# Patient Record
Sex: Female | Born: 1950
Health system: Southern US, Community
[De-identification: ages and names within clinical notes are randomized; demographics above are authoritative.]

## PROBLEM LIST (undated history)

## (undated) DIAGNOSIS — E1140 Type 2 diabetes mellitus with diabetic neuropathy, unspecified: Secondary | ICD-10-CM

## (undated) DIAGNOSIS — I10 Essential (primary) hypertension: Secondary | ICD-10-CM

## (undated) DIAGNOSIS — I350 Nonrheumatic aortic (valve) stenosis: Principal | ICD-10-CM

## (undated) DIAGNOSIS — E785 Hyperlipidemia, unspecified: Secondary | ICD-10-CM

## (undated) HISTORY — DX: Type 2 diabetes mellitus with diabetic neuropathy, unspecified: E11.40

## (undated) HISTORY — DX: Nonrheumatic aortic (valve) stenosis: I35.0

## (undated) HISTORY — DX: Hyperlipidemia, unspecified: E78.5

---

## 1997-05-31 ENCOUNTER — Other Ambulatory Visit: Admission: RE | Admit: 1997-05-31 | Discharge: 1997-05-31 | Payer: Self-pay | Admitting: Obstetrics & Gynecology

## 1999-01-23 ENCOUNTER — Other Ambulatory Visit: Admission: RE | Admit: 1999-01-23 | Discharge: 1999-01-23 | Payer: Self-pay | Admitting: Obstetrics & Gynecology

## 2000-10-22 ENCOUNTER — Other Ambulatory Visit: Admission: RE | Admit: 2000-10-22 | Discharge: 2000-10-22 | Payer: Self-pay | Admitting: Obstetrics & Gynecology

## 2012-07-02 ENCOUNTER — Other Ambulatory Visit: Payer: Self-pay | Admitting: Family Medicine

## 2012-07-02 DIAGNOSIS — Z1231 Encounter for screening mammogram for malignant neoplasm of breast: Secondary | ICD-10-CM

## 2012-07-16 ENCOUNTER — Ambulatory Visit: Payer: Self-pay

## 2012-07-21 ENCOUNTER — Encounter: Payer: BC Managed Care – PPO | Attending: Family | Admitting: *Deleted

## 2012-07-21 VITALS — Ht 65.5 in | Wt 156.2 lb

## 2012-07-21 DIAGNOSIS — E119 Type 2 diabetes mellitus without complications: Secondary | ICD-10-CM | POA: Insufficient documentation

## 2012-07-21 DIAGNOSIS — Z713 Dietary counseling and surveillance: Secondary | ICD-10-CM | POA: Insufficient documentation

## 2012-07-31 ENCOUNTER — Encounter: Payer: Self-pay | Admitting: *Deleted

## 2012-07-31 NOTE — Progress Notes (Signed)
Patient was seen on 07/21/2012 for the first of a series of three diabetes self-management courses [facility]   Current HbA1c: 13.2% on 06/22/2012  The following learning objectives were met by the patient during this course:   Defines the role of glucose and insulin  Identifies type of diabetes and pathophysiology  Defines the diagnostic criteria for diabetes and prediabetes  States the risk factors for Type 2 Diabetes  States the symptoms of Type 2 Diabetes  Defines Type 2 Diabetes treatment goals  Defines Type 2 Diabetes treatment options  States the rationale for glucose monitoring  Identifies A1C, glucose targets, and testing times  Identifies proper sharps disposal  Defines the purpose of a diabetes food plan  Identifies carbohydrate food groups  Defines effects of carbohydrate foods on glucose levels  Identifies carbohydrate choices/grams/food labels  States benefits of physical activity and effect on glucose  Review of suggested activity guidelines  Handouts given during class include:  Type 2 Diabetes: Basics Book  My [place] and Activity Log  Your patient has identified their diabetes self-care support plan as:  Continue with diabetes education   Consider support group meetings  Follow-Up Plan: Core Class 2

## 2012-08-05 ENCOUNTER — Ambulatory Visit
Admission: RE | Admit: 2012-08-05 | Discharge: 2012-08-05 | Disposition: A | Discharge status: Home / Self Care | Payer: BC Managed Care – PPO | Source: Ambulatory Visit | Attending: Family Medicine | Admitting: Family Medicine

## 2012-08-05 DIAGNOSIS — Z1231 Encounter for screening mammogram for malignant neoplasm of breast: Secondary | ICD-10-CM

## 2012-08-11 ENCOUNTER — Encounter: Payer: BC Managed Care – PPO | Attending: Family

## 2012-08-11 DIAGNOSIS — E119 Type 2 diabetes mellitus without complications: Secondary | ICD-10-CM | POA: Insufficient documentation

## 2012-08-11 DIAGNOSIS — Z713 Dietary counseling and surveillance: Secondary | ICD-10-CM | POA: Insufficient documentation

## 2012-08-12 NOTE — Progress Notes (Signed)
Patient was seen on 08/11/12 for the second of a series of three diabetes self-management courses at [facility]. The following learning objectives were met by the patient during this course:   Explain basic nutrition maintenance and quality assurance  Describe causes, symptoms and treatment of hypoglycemia and hyperglycemia  Explain how to manage diabetes during illness  Describe the importance of good nutrition for health and healthy eating strategies  List strategies to follow meal plan when dining out  Describe the effects of alcohol on glucose and how to use it safely  Describe problem solving skills for day-to-day glucose challenges  Describe strategies to use when treatment plan needs to change  Identify important factors involved in successful weight loss  Describe ways to remain physically active  Describe the impact of regular activity on insulin resistance  Identify current diabetes medications, their action on blood glucose, and [pssible side effects.  Handouts given in class:  Refrigerator magnet for Sick Day Guidelines  [facility] Oral medication/insulin handout  Your patient has identified their diabetes self-care support plan as:  [facility] support group  Follow-Up Plan: Patient will attend the final class of the ADA Diabetes Self-Care Education.

## 2012-08-25 DIAGNOSIS — E119 Type 2 diabetes mellitus without complications: Secondary | ICD-10-CM

## 2012-08-28 NOTE — Progress Notes (Signed)
Patient was seen on 08/25/12 for the third of a series of three diabetes self-management courses [facility]. The following learning objectives were met by the patient during this course:    Describe how diabetes changes over time   Identify diabetes complications and ways to prevent them   Describe strategies that can promote heart health including lowering blood pressure and cholesterol   Describe strategies to lower dietary fat and sodium in the diet   Identify physical activities that benefit cardiovascular health   Describe role of stress on blood glucose and develop strategies to address psychosocial issues   Evaluate success in meeting personal goal   Describe the belief that they can live successfully with diabetes day to day   Establish 2-3 goals that they will plan to diligently work on until they return for the free 85-month follow-up visit  The following handouts were given in class:  Goal setting handout  Class evaluation form  Low-sodium seasoning tips  Stress management handout  Your patient has established the following 4 month goals for diabetes self-care:  Count carbohydrates at most of my meals and snacks  Increase my activity level  Your patient has identified these potential barriers to change:  Moderate confidence in making these changes on scale of 6 out of 10  Your patient has identified their diabetes self-care support plan as:  [facility] support group   Follow-Up Plan: Patient was offered a 4 month follow-up visit for diabetes self-management education.

## 2013-01-12 ENCOUNTER — Encounter: Payer: BC Managed Care – PPO | Attending: Family | Admitting: *Deleted

## 2013-01-12 ENCOUNTER — Encounter: Payer: Self-pay | Admitting: *Deleted

## 2013-01-12 VITALS — Ht 65.5 in | Wt 147.6 lb

## 2013-01-12 DIAGNOSIS — Z713 Dietary counseling and surveillance: Secondary | ICD-10-CM | POA: Insufficient documentation

## 2013-01-12 DIAGNOSIS — E119 Type 2 diabetes mellitus without complications: Secondary | ICD-10-CM | POA: Insufficient documentation

## 2013-01-12 NOTE — Patient Instructions (Signed)
PLAN: Contact Tracy's office and request order for testing supplies Begin alternating testing of Fasting glucose and 2 hours after dinner and LOG Try taking 2 tablets of Metformin with Dinner and one tablet with Breakfast. Continue making good food choices and practicing portion control Make appointment with Olivia Mackie for follow-up in approximately 2-3 weeks so that she can evaluate your glucose readings and medication dose.

## 2013-01-12 NOTE — Progress Notes (Signed)
  Patient was seen on 01/12/13 for her 4 month follow-up as a part of the diabetes self-management courses [facility].   Patient self reports the following: Last personal glucose check was Nov. 2014. Is presently out of testing supplies. Was scheduled for follow-up with PCP in December, had to reschedule. Test 1hpp 167mg /dl. She feels she is making good food choices and exercising portion control. Minimal exercise outside of ADLs.. Works 5-6 days per week, very physical job. Is take Metformin 500mg  TID with food.  Diabetes control has improved since diabetes self-management training: significantly Number of days blood glucose is >200: unknown Last MD appointment for diabetes: September 2014 Changes in treatment plan: Return to prescribed testing regimen of alternating FBS and 2hours after dinner. Confidence with ability to manage diabetes: High Areas for improvement with diabetes self-care: Test glucose regularly FBS Willingness to participate in diabetes support group: not at this time  PLAN: Contact Tracy's office and request order for testing supplies Begin alternating testing of Fasting glucose and 2 hours after dinner and LOG Try taking 2 tablets of Metformin with Dinner and one tablet with Breakfast starting this friday. Continue making good food choices and practicing portion control Make appointment with Olivia Mackie for follow-up in approximately 2-3 weeks so that she can evaluate your glucose readings and medication dose.   Follow-Up Plan: Patient to call and schedule as needed.

## 2013-12-05 ENCOUNTER — Encounter (HOSPITAL_COMMUNITY): Payer: Self-pay | Admitting: *Deleted

## 2013-12-05 ENCOUNTER — Emergency Department (HOSPITAL_COMMUNITY)
Admission: EM | Admit: 2013-12-05 | Discharge: 2013-12-05 | Disposition: A | Discharge status: Home / Self Care | Payer: Self-pay | Attending: Emergency Medicine | Admitting: Emergency Medicine

## 2013-12-05 DIAGNOSIS — I152 Hypertension secondary to endocrine disorders: Secondary | ICD-10-CM | POA: Insufficient documentation

## 2013-12-05 DIAGNOSIS — Z79899 Other long term (current) drug therapy: Secondary | ICD-10-CM | POA: Insufficient documentation

## 2013-12-05 DIAGNOSIS — E119 Type 2 diabetes mellitus without complications: Secondary | ICD-10-CM | POA: Insufficient documentation

## 2013-12-05 DIAGNOSIS — R011 Cardiac murmur, unspecified: Secondary | ICD-10-CM | POA: Insufficient documentation

## 2013-12-05 DIAGNOSIS — H539 Unspecified visual disturbance: Secondary | ICD-10-CM | POA: Insufficient documentation

## 2013-12-05 DIAGNOSIS — E785 Hyperlipidemia, unspecified: Secondary | ICD-10-CM | POA: Insufficient documentation

## 2013-12-05 HISTORY — DX: Essential (primary) hypertension: I10

## 2013-12-05 LAB — BASIC METABOLIC PANEL
Anion gap: 14 (ref 5–15)
BUN: 21 mg/dL (ref 6–23)
CO2: 24 meq/L (ref 19–32)
Calcium: 9.7 mg/dL (ref 8.4–10.5)
Chloride: 102 mEq/L (ref 96–112)
Creatinine, Ser: 0.57 mg/dL (ref 0.50–1.10)
GFR calc Af Amer: >90 mL/min (ref >90)
GLUCOSE: 117 mg/dL — AB (ref 70–99)
POTASSIUM: 4.9 meq/L (ref 3.7–5.3)
Sodium: 140 mEq/L (ref 137–147)

## 2013-12-05 LAB — URINALYSIS, ROUTINE W REFLEX MICROSCOPIC
Bilirubin Urine: NEGATIVE
GLUCOSE, UA: NEGATIVE mg/dL
Ketones, ur: NEGATIVE mg/dL
LEUKOCYTES UA: NEGATIVE
Nitrite: NEGATIVE
PROTEIN: 100 mg/dL — AB
SPECIFIC GRAVITY, URINE: 1.012 (ref 1.005–1.030)
Urobilinogen, UA: 0.2 mg/dL (ref 0.0–1.0)
pH: 7 (ref 5.0–8.0)

## 2013-12-05 LAB — URINE MICROSCOPIC-ADD ON

## 2013-12-05 MED ORDER — LISINOPRIL 10 MG PO TABS: 10.0000 mg | ORAL_TABLET | Freq: Every day | ORAL | Status: DC

## 2013-12-05 MED ORDER — CLONIDINE HCL 0.1 MG PO TABS
0.1000 mg | ORAL_TABLET | Freq: Every day | ORAL | Status: DC
  Administered 2013-12-05: 0.1 mg via ORAL
  Filled 2013-12-05: qty 1

## 2013-12-05 MED ORDER — METFORMIN HCL ER (OSM) 1000 MG PO TB24
1000.0000 mg | ORAL_TABLET | Freq: Two times a day (BID) | ORAL | Status: DC
Start: 2013-12-05 — End: 2023-11-04

## 2013-12-05 NOTE — Discharge Instructions (Signed)
-I wrote you a prescription for lisinopril and metformin to get you until you see a PCP. -You should re-establish with a PCP to manage your blood pressure and diabetes. -Some resources are below to help you find a doctor. -I also gave you information for the [place], which would be a reasonable choice. -You should also follow up with an ophthalmologist regarding your blurry vision to check your prescription. -Your PCP can refer you to an ophthalmologist.    Emergency Department Resource Guide 1) Find a Doctor and Pay Out of Pocket Although you won't have to find out who is covered by your insurance plan, it is a good idea to ask around and get recommendations. You will then need to call the office and see if the doctor you have chosen will accept you as a new patient and what types of options they offer for patients who are self-pay. Some doctors offer discounts or will set up payment plans for their patients who do not have insurance, but you will need to ask so you aren't surprised when you get to your appointment.  2) Contact Your Local Health Department Not all health departments have doctors that can see patients for sick visits, but many do, so it is worth a call to see if yours does. If you don't know where your local health department is, you can check in your phone book. The CDC also has a tool to help you locate your state's health department, and many state websites also have listings of all of their local health departments.  3) Find a [facility] If your illness is not likely to be very severe or complicated, you may want to try a walk in clinic. These are popping up all over the country in pharmacies, drugstores, and shopping centers. They're usually staffed by nurse practitioners or physician assistants that have been trained to treat common illnesses and complaints. They're usually fairly quick and inexpensive. However, if you have serious medical issues  or chronic medical problems, these are probably not your best option.  No Primary Care Doctor: - Call Health Connect at  [phone] - they can help you locate a primary care doctor that  accepts your insurance, provides certain services, etc. - Physician Referral Service- [phone]  Chronic Pain Problems: Organization         Address  Phone   Notes  [facility]  [phone] Patients need to be referred by their primary care doctor.   Medication Assistance: Organization         Address  Phone   Notes  [facility] Medication [facility] [address] [place], [place] [address] [phone] --Must be a resident of [facility] -- Must have NO insurance coverage whatsoever (no Medicaid/ Medicare, etc.) -- The pt. MUST have a primary care doctor that directs their care regularly and follows them in the community   MedAssist  [phone]   [employer]  [phone]    Agencies that provide inexpensive medical care: Organization         Address  Phone   Notes  [place]  [phone]   [facility]    [phone]   [facility] [place], [place] [address] [phone]   [place] [address] [place] [phone]   Planned Parenthood    [phone]   [place]    [phone]   [facility] and [facility]  [address], [place] Phone:  [phone], Fax:  [phone] Hours of Operation:  9 am - 6 pm, M-F.  Also accepts Medicaid/Medicare and self-pay.  [facility] for [place], [address], [place] Phone: [phone], Fax: [phone]. Hours of Operation:  8:30 am - 5:30 pm, M-F.  Also accepts Medicaid and self-pay.  [facility] [address] [place] Phone: [phone]   [place], [place], [place]  [phone], Ext. [phone] Mondays & Thursdays: 7-9 AM.  First 15 patients are seen on a first come, first serve basis.    [place] Providers:  Organization         Address  Phone   Notes  [facility] [address], [place] [phone] Also accepts self-pay patients.  [facility] [id number] [place], [place]  [phone]   [place], [address], [place] [phone]   [facility] [address] [place] [phone]   Lucianne Lei [address], [place]   [phone] Only accepts [place] Access [place] patients after they have their name applied to their card.   Self-Pay (no insurance) in [facility]:  Organization         Address  Phone   Notes  Sickle Cell Patients, [facility] Internal Medicine [place] [phone]   [facility] Care [place] [phone]   [facility] [place]  [place], [place], [place] [phone]   [facility]/Dr. Osei-Bonsu  [address], [place] or [address], [place] [phone] Phone number for both [place] and [place] locations is the same.  Urgent Medical and [facility] [address], [place] [phone]   [facility] [address] [place] or [address] [phone]   [facility] [address], [place] [phone], phone; [phone], fax Sees patients 1st and 3rd Saturday of every month.  Must not qualify for public or private insurance (i.e. Medicaid, Medicare, [place] Health Choice, Veterans' Benefits)  Household income should be no more than 200% of the poverty level The clinic cannot treat you if you are pregnant or think you are pregnant  Sexually transmitted  diseases are not treated at the clinic.   Dental Care: Organization         Address  Phone  Notes  [facility] Department of [facility] [place] [phone] Accepts children up to age 56 who are enrolled in [place] or [place]; pregnant women with a Medicaid card; and children who have applied for Medicaid or [place] Health Choice, but were declined, whose parents can pay a reduced fee at time of service.  [facility] Department of [facility]  [address], [place] [phone] Accepts children up to age 36 who are enrolled in [place] or [place]; pregnant women with a Medicaid card; and children who have applied for Medicaid or [place] Health Choice, but were declined, whose parents can pay a reduced fee at time of service.  [place] Adult Dental Access PROGRAM  [place] [phone] Patients are seen by appointment only. Walk-ins are not accepted. [place] will see patients 20 years of age and older. Monday - Tuesday (8am-5pm) Most Wednesdays (8:30-5pm) $30 per visit, cash only  [facility] Adult Dental Access PROGRAM  [address], [facility] [phone] Patients are seen by appointment only. Walk-ins are not accepted. [place] will see patients 37 years of age and older. One Wednesday Evening (Monthly: Volunteer Based).  $30 per visit, cash only  [place]  [phone] for adults; Children under age 40, call Graduate Pediatric Dentistry at [phone]. Children aged 30-14, please call [phone] to request a pediatric application.  Dental services are provided in all areas of dental care including fillings, crowns and bridges, complete and partial dentures, implants, gum treatment, root canals, and extractions. Preventive care is also provided. Treatment is provided to both adults and children. Patients are selected via a  lottery and there is often a waiting list.   [facility] [address] [place]  [phone] [email or web address]   [facility] [address] [place], [place] [phone], Ext. [phone] Second and Fourth Thursday of each month, opens at 6:30 AM; Clinic ends at 9 AM.  Patients are seen on a first-come first-served basis, and a limited number are seen during each clinic.   [facility]  [address], [place] [phone]   Eligibility Requirements You must have lived in [place], [place], or [place] counties for at least the last three months.   You cannot be eligible for state or federal sponsored [employer], including [employer], [place], or [employer].   You generally cannot be eligible for healthcare insurance through your employer.    How to apply: Eligibility screenings are held every Tuesday and Wednesday afternoon from 1:00 pm until 4:00 pm. You do not need an appointment for the interview!  [facility] [address], [place], [place]   [place]  [place] Department  [place]  [phone]    Behavioral Health Resources in the Community: Intensive Outpatient Programs Organization         Address  Phone  Notes  [facility] [place]. [address] [place], [place] [phone]   [facility] Outpatient [address] [place], [place]   ADS: Alcohol & Drug S[facility] [address] [place], [place]   [place] [address],  [place], [place] or [phone]   Substance Abuse Resources Organization         Address  Phone  Notes  Alcohol and Drug Services  [phone]   [place]  [phone]   [facility] [place]   Chinita Pester  [phone]   Residential &  Outpatient Substance Abuse Program  [phone]   Psychological Services Organization         Address  Phone  Notes  [facility] [place]  [place]  [phone]   [place] [address], [place] [phone] or [phone]    Mobile Crisis Teams Organization         Address  Phone  Notes  [facility], Mobile Crisis Care Unit  [phone]  Assertive Psychotherapeutic Services  [address]. Lady Gary, [place]  [facility] [address] [place] [phone]   Self-Help/Support Groups Organization         Address  Phone             Notes  [place]. of [place] - variety of support groups  [place] Call for more information  Narcotics Anonymous (NA), [facility] [address], [employer] [place]  2 meetings at this location   Special educational needs teacher         Address  Phone  Notes  ASAP Residential Treatment [place],    [place]  [phone]   [facility]  [address] [place] [phone], [place], [place]   [place], [place] [phone] Admissions: 8am-3pm M-F  Incentives Substance [place] [address],    [place], [place] [phone]   [facility] [address] [place], [place], [place]   [facility] [address],  [place], [place]   [facility]ight Programs - Intensive Outpatient [address], [place], [place]   [facility] [place]  [place], [place] [phone] or [phone]   [facility] Services (RTS) [address], [place], [place] Accepts Medicaid  [facility] [place] [address],  [place] [phone] Substance Abuse/Addiction Treatment   [facility] Organization          Address  Phone  Notes  [facility]  [phone]   Domenic Schwab, PhD [address] Arlis Porta [place], [place]   [phone] or [phone]   [place] Malone [place], [place] [phone]   [facility] [address] [place], [place] [phone] Insurance/Medicaid/sponsorship through [facility] and Families [address], [address] [place]                                    [place], [place] [phone] [place] [address], [place] [phone]    Dr. Adele Schilder  [phone]   [facility] Dept. 1) [address], [place] 2) [place] 3)  [place] [address], [place] [phone]  [phone]   [place] [phone] or [phone] (After Hours)

## 2013-12-05 NOTE — ED Provider Notes (Signed)
CSN: [id number]     Arrival date & time 12/05/13  1218 History   First MD Initiated Contact with Patient 12/05/13 1320     Chief Complaint  Patient presents with  . Hypertension  . Blurred Vision   HPI Christine Strong is a 63 year old woman with history of DM2, hyperlipidemia, and hypertension presenting with blurred vision and hypertension.  She says that she has not taken her blood pressure medication for about 6 months and she recently ran out of her metformin.  She reports having blurry vision for the over a month. She saw an ophthalmologist a month ago and was given a prescription for glasses, which she reports has helped with her vision.  However, she has continued to notice that her vision is blurry, and it was worse this morning.  She denies double vision.  She thought it could be related to her blood pressure, so she went to an urgent care clinic.  She was noted to have a blood pressure of 224/105, so she was told to come to the ER.  She denies chest pain, shortness of breath, nausea, vomiting, or headache currently.  Past Medical History  Diagnosis Date  . Diabetes mellitus without complication   . Hyperlipidemia   . Hypertension    History reviewed. No pertinent past surgical history. No family history on file. History  Substance Use Topics  . Smoking status: Never Smoker   . Smokeless tobacco: Not on file  . Alcohol Use: Not on file   OB History    No data available     Review of Systems  Constitutional: Negative for fever, chills, activity change, appetite change and fatigue.  HENT: Negative for congestion, rhinorrhea and sore throat.   Eyes: Positive for visual disturbance (Blurry vision.).  Respiratory: Negative for cough, chest tightness, shortness of breath and wheezing.   Cardiovascular: Negative for chest pain, palpitations and leg swelling.  Gastrointestinal: Negative for nausea, vomiting, abdominal pain, diarrhea, constipation and anal bleeding.   Genitourinary: Negative for dysuria and difficulty urinating.  Musculoskeletal: Negative for myalgias, back pain and arthralgias.  Skin: Negative for rash.  Neurological: Negative for dizziness, speech difficulty, weakness, light-headedness, numbness and headaches.      Allergies  Review of patient's allergies indicates no known allergies.  Home Medications   Prior to Admission medications   Medication Sig Start Date End Date Taking? Authorizing Provider  atorvastatin (LIPITOR) 20 MG tablet Take 20 mg by mouth daily.   Yes Historical Provider, MD  Cyanocobalamin (VITAMIN B 12 PO) Take 2 tablets by mouth daily.   Yes Historical Provider, MD  diphenhydrAMINE (BENADRYL) 25 mg capsule Take 25 mg by mouth every 6 (six) hours as needed for sleep.   Yes Historical Provider, MD  lisinopril (PRINIVIL,ZESTRIL) 10 MG tablet Take 10 mg by mouth daily.   Yes Historical Provider, MD  metformin (FORTAMET) 1000 MG (OSM) 24 hr tablet Take 1,000 mg by mouth 2 (two) times daily with a meal.   Yes Historical Provider, MD  Misc Natural Products (GREEN TEA) TABS Take 2 tablets by mouth daily.   Yes Historical Provider, MD   BP 197/91 mmHg  Pulse 74  Temp(Src) 97.7 F (36.5 C) (Oral)  Resp 16  SpO2 99% Physical Exam  Constitutional: She is oriented to person, place, and time. She appears well-developed and well-nourished. No distress.  HENT:  Head: Normocephalic and atraumatic.  Mouth/Throat: No oropharyngeal exudate.  Eyes: Conjunctivae and EOM are normal. Pupils are equal,  round, and reactive to light. No scleral icterus.  Normal optic discs bilaterally with no evidence of retinal hemorrhage on ophthalmoscopy. Confrontation visual fields normal.  Neck: Normal range of motion. Neck supple.  Cardiovascular: Normal rate and regular rhythm.   Murmur (2/6 systolic murmur.) heard. Pulmonary/Chest: Effort normal and breath sounds normal. No respiratory distress.  Abdominal: Soft. Bowel sounds are  normal. She exhibits no distension. There is no tenderness.  Musculoskeletal: Normal range of motion. She exhibits no edema or tenderness.  Neurological: She is alert and oriented to person, place, and time. No cranial nerve deficit. She exhibits normal muscle tone.  Skin: Skin is warm and dry. No rash noted. No erythema.  Psychiatric: She has a normal mood and affect.    ED Course  Procedures (including critical care time) Labs Review Labs Reviewed  BASIC METABOLIC PANEL - Abnormal; Notable for the following:    Glucose, Bld 117 (*)    All other components within normal limits  URINALYSIS, ROUTINE W REFLEX MICROSCOPIC - Abnormal; Notable for the following:    Hgb urine dipstick SMALL (*)    Protein, ur 100 (*)    All other components within normal limits  URINE MICROSCOPIC-ADD ON    Imaging Review No results found.   EKG Interpretation None      MDM   Final diagnoses:  Hypertension due to endocrine disorder   Blood pressure elevated, but no evidence of end organ damage on history.  Will check basic labs and urinalysis and plan to refill patient's lisinopril and metformin pending results.  She has no insurance currently, so will give resources to establish with new PCP.  Will need to follow up with ophthalmologist for dilated eye exam to address blurred vision.  3:05 pm: Blood pressure improve after receiving clonidine and labs unremarkable.  Will discharge home with med prescriptions and instructions to establish with PCP.  Arman Filter, MD 12/05/13 [place], MD 12/05/13 1538

## 2013-12-05 NOTE — ED Notes (Signed)
Pt comes to ED with c/o hypertension and blurred vision. Sts she's been out of her b/p meds for about 6 months due to loss of insurance. Yesterday she developed blurred vision that got worse today so she went to urgent care where they checked her b/p and was "too high" so they sent her here.

## 2013-12-05 NOTE — ED Notes (Signed)
She states that she has had "blurred vision-and I see 'spots' sometimes; for about a month or two".  She states her former prescription for b/p meds expired, therefore she was unable to fill them.  She states she has had good cbg control, with her sugars averaging ~150.  She c/o some aching of proximal right arm (currently pain-free).  She denies having any h/a with this; and has been able to consistently perform her somewhat strenuous duties working for a [employer].

## 2016-05-28 DIAGNOSIS — E113312 Type 2 diabetes mellitus with moderate nonproliferative diabetic retinopathy with macular edema, left eye: Secondary | ICD-10-CM | POA: Diagnosis not present

## 2016-05-28 DIAGNOSIS — E11311 Type 2 diabetes mellitus with unspecified diabetic retinopathy with macular edema: Secondary | ICD-10-CM | POA: Diagnosis not present

## 2016-06-12 DIAGNOSIS — H524 Presbyopia: Secondary | ICD-10-CM | POA: Diagnosis not present

## 2016-06-12 DIAGNOSIS — H5203 Hypermetropia, bilateral: Secondary | ICD-10-CM | POA: Diagnosis not present

## 2016-06-12 DIAGNOSIS — H52203 Unspecified astigmatism, bilateral: Secondary | ICD-10-CM | POA: Diagnosis not present

## 2016-07-02 DIAGNOSIS — E11311 Type 2 diabetes mellitus with unspecified diabetic retinopathy with macular edema: Secondary | ICD-10-CM | POA: Diagnosis not present

## 2016-07-02 DIAGNOSIS — E113313 Type 2 diabetes mellitus with moderate nonproliferative diabetic retinopathy with macular edema, bilateral: Secondary | ICD-10-CM | POA: Diagnosis not present

## 2016-07-02 DIAGNOSIS — E1165 Type 2 diabetes mellitus with hyperglycemia: Secondary | ICD-10-CM | POA: Diagnosis not present

## 2016-07-29 DIAGNOSIS — E1122 Type 2 diabetes mellitus with diabetic chronic kidney disease: Secondary | ICD-10-CM | POA: Diagnosis not present

## 2016-07-29 DIAGNOSIS — R801 Persistent proteinuria, unspecified: Secondary | ICD-10-CM | POA: Diagnosis not present

## 2016-07-29 DIAGNOSIS — I129 Hypertensive chronic kidney disease with stage 1 through stage 4 chronic kidney disease, or unspecified chronic kidney disease: Secondary | ICD-10-CM | POA: Diagnosis not present

## 2016-07-29 DIAGNOSIS — N182 Chronic kidney disease, stage 2 (mild): Secondary | ICD-10-CM | POA: Diagnosis not present

## 2016-07-29 DIAGNOSIS — E1165 Type 2 diabetes mellitus with hyperglycemia: Secondary | ICD-10-CM | POA: Diagnosis not present

## 2016-07-29 DIAGNOSIS — E559 Vitamin D deficiency, unspecified: Secondary | ICD-10-CM | POA: Diagnosis not present

## 2016-07-29 DIAGNOSIS — E113313 Type 2 diabetes mellitus with moderate nonproliferative diabetic retinopathy with macular edema, bilateral: Secondary | ICD-10-CM | POA: Diagnosis not present

## 2016-08-06 DIAGNOSIS — E113312 Type 2 diabetes mellitus with moderate nonproliferative diabetic retinopathy with macular edema, left eye: Secondary | ICD-10-CM | POA: Diagnosis not present

## 2016-08-06 DIAGNOSIS — E113313 Type 2 diabetes mellitus with moderate nonproliferative diabetic retinopathy with macular edema, bilateral: Secondary | ICD-10-CM | POA: Diagnosis not present

## 2016-09-03 DIAGNOSIS — E113313 Type 2 diabetes mellitus with moderate nonproliferative diabetic retinopathy with macular edema, bilateral: Secondary | ICD-10-CM | POA: Diagnosis not present

## 2016-09-03 DIAGNOSIS — E113312 Type 2 diabetes mellitus with moderate nonproliferative diabetic retinopathy with macular edema, left eye: Secondary | ICD-10-CM | POA: Diagnosis not present

## 2016-09-28 DIAGNOSIS — R69 Illness, unspecified: Secondary | ICD-10-CM | POA: Diagnosis not present

## 2016-10-15 DIAGNOSIS — E113312 Type 2 diabetes mellitus with moderate nonproliferative diabetic retinopathy with macular edema, left eye: Secondary | ICD-10-CM | POA: Diagnosis not present

## 2016-10-15 DIAGNOSIS — E113313 Type 2 diabetes mellitus with moderate nonproliferative diabetic retinopathy with macular edema, bilateral: Secondary | ICD-10-CM | POA: Diagnosis not present

## 2016-10-16 DIAGNOSIS — E1121 Type 2 diabetes mellitus with diabetic nephropathy: Secondary | ICD-10-CM | POA: Diagnosis not present

## 2016-10-16 DIAGNOSIS — Z7984 Long term (current) use of oral hypoglycemic drugs: Secondary | ICD-10-CM | POA: Diagnosis not present

## 2016-10-16 DIAGNOSIS — E7800 Pure hypercholesterolemia, unspecified: Secondary | ICD-10-CM | POA: Diagnosis not present

## 2016-10-16 DIAGNOSIS — I10 Essential (primary) hypertension: Secondary | ICD-10-CM | POA: Diagnosis not present

## 2016-10-16 DIAGNOSIS — Z23 Encounter for immunization: Secondary | ICD-10-CM | POA: Diagnosis not present

## 2016-10-23 DIAGNOSIS — E7800 Pure hypercholesterolemia, unspecified: Secondary | ICD-10-CM | POA: Diagnosis not present

## 2016-10-23 DIAGNOSIS — Z7984 Long term (current) use of oral hypoglycemic drugs: Secondary | ICD-10-CM | POA: Diagnosis not present

## 2016-10-23 DIAGNOSIS — E1121 Type 2 diabetes mellitus with diabetic nephropathy: Secondary | ICD-10-CM | POA: Diagnosis not present

## 2016-10-23 DIAGNOSIS — I10 Essential (primary) hypertension: Secondary | ICD-10-CM | POA: Diagnosis not present

## 2016-12-10 DIAGNOSIS — E113313 Type 2 diabetes mellitus with moderate nonproliferative diabetic retinopathy with macular edema, bilateral: Secondary | ICD-10-CM | POA: Diagnosis not present

## 2016-12-10 DIAGNOSIS — E113312 Type 2 diabetes mellitus with moderate nonproliferative diabetic retinopathy with macular edema, left eye: Secondary | ICD-10-CM | POA: Diagnosis not present

## 2017-01-23 DIAGNOSIS — E7800 Pure hypercholesterolemia, unspecified: Secondary | ICD-10-CM | POA: Diagnosis not present

## 2017-01-29 DIAGNOSIS — I129 Hypertensive chronic kidney disease with stage 1 through stage 4 chronic kidney disease, or unspecified chronic kidney disease: Secondary | ICD-10-CM | POA: Diagnosis not present

## 2017-01-29 DIAGNOSIS — R801 Persistent proteinuria, unspecified: Secondary | ICD-10-CM | POA: Diagnosis not present

## 2017-01-29 DIAGNOSIS — E1122 Type 2 diabetes mellitus with diabetic chronic kidney disease: Secondary | ICD-10-CM | POA: Diagnosis not present

## 2017-01-29 DIAGNOSIS — E559 Vitamin D deficiency, unspecified: Secondary | ICD-10-CM | POA: Diagnosis not present

## 2017-01-29 DIAGNOSIS — N182 Chronic kidney disease, stage 2 (mild): Secondary | ICD-10-CM | POA: Diagnosis not present

## 2017-02-04 DIAGNOSIS — E113312 Type 2 diabetes mellitus with moderate nonproliferative diabetic retinopathy with macular edema, left eye: Secondary | ICD-10-CM | POA: Diagnosis not present

## 2017-02-04 DIAGNOSIS — E113313 Type 2 diabetes mellitus with moderate nonproliferative diabetic retinopathy with macular edema, bilateral: Secondary | ICD-10-CM | POA: Diagnosis not present

## 2017-03-18 DIAGNOSIS — E113312 Type 2 diabetes mellitus with moderate nonproliferative diabetic retinopathy with macular edema, left eye: Secondary | ICD-10-CM | POA: Diagnosis not present

## 2017-03-18 DIAGNOSIS — E113313 Type 2 diabetes mellitus with moderate nonproliferative diabetic retinopathy with macular edema, bilateral: Secondary | ICD-10-CM | POA: Diagnosis not present

## 2017-04-09 DIAGNOSIS — E119 Type 2 diabetes mellitus without complications: Secondary | ICD-10-CM | POA: Diagnosis not present

## 2017-04-09 DIAGNOSIS — H538 Other visual disturbances: Secondary | ICD-10-CM | POA: Diagnosis not present

## 2017-04-09 DIAGNOSIS — H2513 Age-related nuclear cataract, bilateral: Secondary | ICD-10-CM | POA: Diagnosis not present

## 2017-04-09 DIAGNOSIS — H348112 Central retinal vein occlusion, right eye, stable: Secondary | ICD-10-CM | POA: Diagnosis not present

## 2017-04-09 DIAGNOSIS — Z7984 Long term (current) use of oral hypoglycemic drugs: Secondary | ICD-10-CM | POA: Diagnosis not present

## 2017-05-02 DIAGNOSIS — M79672 Pain in left foot: Secondary | ICD-10-CM | POA: Diagnosis not present

## 2017-05-07 DIAGNOSIS — Z7984 Long term (current) use of oral hypoglycemic drugs: Secondary | ICD-10-CM | POA: Diagnosis not present

## 2017-05-07 DIAGNOSIS — N182 Chronic kidney disease, stage 2 (mild): Secondary | ICD-10-CM | POA: Diagnosis not present

## 2017-05-07 DIAGNOSIS — I10 Essential (primary) hypertension: Secondary | ICD-10-CM | POA: Diagnosis not present

## 2017-05-07 DIAGNOSIS — E7800 Pure hypercholesterolemia, unspecified: Secondary | ICD-10-CM | POA: Diagnosis not present

## 2017-05-07 DIAGNOSIS — E11311 Type 2 diabetes mellitus with unspecified diabetic retinopathy with macular edema: Secondary | ICD-10-CM | POA: Diagnosis not present

## 2017-05-20 DIAGNOSIS — E113313 Type 2 diabetes mellitus with moderate nonproliferative diabetic retinopathy with macular edema, bilateral: Secondary | ICD-10-CM | POA: Diagnosis not present

## 2017-05-20 DIAGNOSIS — H35033 Hypertensive retinopathy, bilateral: Secondary | ICD-10-CM | POA: Diagnosis not present

## 2017-07-15 DIAGNOSIS — H43821 Vitreomacular adhesion, right eye: Secondary | ICD-10-CM | POA: Diagnosis not present

## 2017-07-15 DIAGNOSIS — E113312 Type 2 diabetes mellitus with moderate nonproliferative diabetic retinopathy with macular edema, left eye: Secondary | ICD-10-CM | POA: Diagnosis not present

## 2017-08-13 DIAGNOSIS — N182 Chronic kidney disease, stage 2 (mild): Secondary | ICD-10-CM | POA: Diagnosis not present

## 2017-08-13 DIAGNOSIS — I129 Hypertensive chronic kidney disease with stage 1 through stage 4 chronic kidney disease, or unspecified chronic kidney disease: Secondary | ICD-10-CM | POA: Diagnosis not present

## 2017-08-13 DIAGNOSIS — M9080 Osteopathy in diseases classified elsewhere, unspecified site: Secondary | ICD-10-CM | POA: Diagnosis not present

## 2017-08-13 DIAGNOSIS — E1122 Type 2 diabetes mellitus with diabetic chronic kidney disease: Secondary | ICD-10-CM | POA: Diagnosis not present

## 2017-08-13 DIAGNOSIS — I951 Orthostatic hypotension: Secondary | ICD-10-CM | POA: Diagnosis not present

## 2017-08-13 DIAGNOSIS — E889 Metabolic disorder, unspecified: Secondary | ICD-10-CM | POA: Diagnosis not present

## 2017-08-13 DIAGNOSIS — R801 Persistent proteinuria, unspecified: Secondary | ICD-10-CM | POA: Diagnosis not present

## 2017-08-13 DIAGNOSIS — E559 Vitamin D deficiency, unspecified: Secondary | ICD-10-CM | POA: Diagnosis not present

## 2017-09-16 DIAGNOSIS — Z7984 Long term (current) use of oral hypoglycemic drugs: Secondary | ICD-10-CM | POA: Diagnosis not present

## 2017-09-16 DIAGNOSIS — E113313 Type 2 diabetes mellitus with moderate nonproliferative diabetic retinopathy with macular edema, bilateral: Secondary | ICD-10-CM | POA: Diagnosis not present

## 2017-11-05 DIAGNOSIS — H2513 Age-related nuclear cataract, bilateral: Secondary | ICD-10-CM | POA: Diagnosis not present

## 2017-11-05 DIAGNOSIS — H348112 Central retinal vein occlusion, right eye, stable: Secondary | ICD-10-CM | POA: Diagnosis not present

## 2017-11-11 DIAGNOSIS — Z794 Long term (current) use of insulin: Secondary | ICD-10-CM | POA: Diagnosis not present

## 2017-11-11 DIAGNOSIS — E113313 Type 2 diabetes mellitus with moderate nonproliferative diabetic retinopathy with macular edema, bilateral: Secondary | ICD-10-CM | POA: Diagnosis not present

## 2017-11-16 DIAGNOSIS — R69 Illness, unspecified: Secondary | ICD-10-CM | POA: Diagnosis not present

## 2017-11-19 DIAGNOSIS — I129 Hypertensive chronic kidney disease with stage 1 through stage 4 chronic kidney disease, or unspecified chronic kidney disease: Secondary | ICD-10-CM | POA: Diagnosis not present

## 2017-11-19 DIAGNOSIS — N182 Chronic kidney disease, stage 2 (mild): Secondary | ICD-10-CM | POA: Diagnosis not present

## 2017-11-19 DIAGNOSIS — E559 Vitamin D deficiency, unspecified: Secondary | ICD-10-CM | POA: Diagnosis not present

## 2017-11-19 DIAGNOSIS — M9080 Osteopathy in diseases classified elsewhere, unspecified site: Secondary | ICD-10-CM | POA: Diagnosis not present

## 2017-11-19 DIAGNOSIS — E875 Hyperkalemia: Secondary | ICD-10-CM | POA: Diagnosis not present

## 2017-11-19 DIAGNOSIS — E1122 Type 2 diabetes mellitus with diabetic chronic kidney disease: Secondary | ICD-10-CM | POA: Diagnosis not present

## 2017-11-19 DIAGNOSIS — E889 Metabolic disorder, unspecified: Secondary | ICD-10-CM | POA: Diagnosis not present

## 2017-11-19 DIAGNOSIS — R801 Persistent proteinuria, unspecified: Secondary | ICD-10-CM | POA: Diagnosis not present

## 2017-12-12 DIAGNOSIS — H2513 Age-related nuclear cataract, bilateral: Secondary | ICD-10-CM | POA: Diagnosis not present

## 2017-12-12 DIAGNOSIS — R011 Cardiac murmur, unspecified: Secondary | ICD-10-CM | POA: Diagnosis not present

## 2017-12-18 DIAGNOSIS — H25812 Combined forms of age-related cataract, left eye: Secondary | ICD-10-CM | POA: Diagnosis not present

## 2017-12-18 DIAGNOSIS — E119 Type 2 diabetes mellitus without complications: Secondary | ICD-10-CM | POA: Diagnosis not present

## 2017-12-18 DIAGNOSIS — I10 Essential (primary) hypertension: Secondary | ICD-10-CM | POA: Diagnosis not present

## 2017-12-18 DIAGNOSIS — R011 Cardiac murmur, unspecified: Secondary | ICD-10-CM | POA: Diagnosis not present

## 2017-12-19 HISTORY — PX: CATARACT EXTRACTION: SUR2

## 2018-01-13 DIAGNOSIS — E113313 Type 2 diabetes mellitus with moderate nonproliferative diabetic retinopathy with macular edema, bilateral: Secondary | ICD-10-CM | POA: Diagnosis not present

## 2018-01-13 DIAGNOSIS — H43821 Vitreomacular adhesion, right eye: Secondary | ICD-10-CM | POA: Diagnosis not present

## 2018-01-13 DIAGNOSIS — Z7984 Long term (current) use of oral hypoglycemic drugs: Secondary | ICD-10-CM | POA: Diagnosis not present

## 2018-01-20 DIAGNOSIS — I10 Essential (primary) hypertension: Secondary | ICD-10-CM | POA: Diagnosis not present

## 2018-01-20 DIAGNOSIS — R011 Cardiac murmur, unspecified: Secondary | ICD-10-CM | POA: Diagnosis not present

## 2018-01-20 DIAGNOSIS — N182 Chronic kidney disease, stage 2 (mild): Secondary | ICD-10-CM | POA: Diagnosis not present

## 2018-01-20 DIAGNOSIS — Z7984 Long term (current) use of oral hypoglycemic drugs: Secondary | ICD-10-CM | POA: Diagnosis not present

## 2018-01-20 DIAGNOSIS — E1121 Type 2 diabetes mellitus with diabetic nephropathy: Secondary | ICD-10-CM | POA: Diagnosis not present

## 2018-01-20 DIAGNOSIS — E7800 Pure hypercholesterolemia, unspecified: Secondary | ICD-10-CM | POA: Diagnosis not present

## 2018-01-27 DIAGNOSIS — E1121 Type 2 diabetes mellitus with diabetic nephropathy: Secondary | ICD-10-CM | POA: Diagnosis not present

## 2018-01-27 DIAGNOSIS — I10 Essential (primary) hypertension: Secondary | ICD-10-CM | POA: Diagnosis not present

## 2018-01-27 DIAGNOSIS — Z7984 Long term (current) use of oral hypoglycemic drugs: Secondary | ICD-10-CM | POA: Diagnosis not present

## 2018-01-27 DIAGNOSIS — E7800 Pure hypercholesterolemia, unspecified: Secondary | ICD-10-CM | POA: Diagnosis not present

## 2018-01-27 DIAGNOSIS — R011 Cardiac murmur, unspecified: Secondary | ICD-10-CM | POA: Diagnosis not present

## 2018-01-27 DIAGNOSIS — N182 Chronic kidney disease, stage 2 (mild): Secondary | ICD-10-CM | POA: Diagnosis not present

## 2018-02-07 DIAGNOSIS — I350 Nonrheumatic aortic (valve) stenosis: Secondary | ICD-10-CM

## 2018-02-07 HISTORY — DX: Nonrheumatic aortic (valve) stenosis: I35.0

## 2018-02-10 DIAGNOSIS — D649 Anemia, unspecified: Secondary | ICD-10-CM | POA: Diagnosis not present

## 2018-02-20 DIAGNOSIS — D649 Anemia, unspecified: Secondary | ICD-10-CM | POA: Diagnosis not present

## 2018-02-20 DIAGNOSIS — Z8639 Personal history of other endocrine, nutritional and metabolic disease: Secondary | ICD-10-CM | POA: Diagnosis not present

## 2018-02-20 DIAGNOSIS — R197 Diarrhea, unspecified: Secondary | ICD-10-CM | POA: Diagnosis not present

## 2018-02-27 ENCOUNTER — Encounter: Payer: Self-pay | Admitting: Cardiology

## 2018-02-27 ENCOUNTER — Ambulatory Visit: Payer: Medicare HMO | Admitting: Cardiology

## 2018-02-27 VITALS — BP 174/83 | HR 73 | Ht 65.0 in | Wt 138.8 lb

## 2018-02-27 DIAGNOSIS — E785 Hyperlipidemia, unspecified: Secondary | ICD-10-CM | POA: Diagnosis not present

## 2018-02-27 DIAGNOSIS — E1169 Type 2 diabetes mellitus with other specified complication: Secondary | ICD-10-CM | POA: Diagnosis not present

## 2018-02-27 DIAGNOSIS — R011 Cardiac murmur, unspecified: Secondary | ICD-10-CM | POA: Diagnosis not present

## 2018-02-27 DIAGNOSIS — I10 Essential (primary) hypertension: Secondary | ICD-10-CM | POA: Diagnosis not present

## 2018-02-27 DIAGNOSIS — I350 Nonrheumatic aortic (valve) stenosis: Secondary | ICD-10-CM | POA: Insufficient documentation

## 2018-02-27 NOTE — Progress Notes (Signed)
PCP: Aretta Nip, MD  Clinic Note: Chief Complaint  Patient presents with  . New Admit To SNF    Murmur  . Hypertension    HPI: Christine Strong is a 68 y.o. female with HTN, HLD & DM-2 who is being seen today for the evaluation of HEART MURMUR (noted during Cataract Sgx evaluation) at the request of Rankins, Bill Salinas, MD.  Christine Strong was last seen on January 27, 2018 by Dr. Radene Ou.  She has a history of hypertension hyperlipoproteinemia as well as proteinuria from CKD stage II.  She has diabetes mellitus, type II with neuropathy.  She was there for routine medication check apparently after having been switched from amlodipine to nifedipine.  Noted her blood pressures were relatively stable.  She is anemic on iron supplementation. Apparently when she was seen for preop evaluation for her cataract surgery she was told that she had a murmur.  This is thought to be a new finding, based on review of prior examinations.  Recent Hospitalizations: None  Studies Personally Reviewed - (if available, images/films reviewed: From Epic Chart or Care Everywhere)  None  Interval History: Christine Strong presents here today for evaluation of basically because of a murmur heard by a PA on exam for preop evaluation for cataracts.  Apparently this is not been diagnosed before. 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  She does lots of walking around and denies any chest discomfort or dyspnea.  She has mild end of day edema after being on her feet all day long.  Otherwise she is pretty much asymptomatic from a cardiac standpoint.  No chest pain or pressure with rest or exertion.  No exertional or resting dyspnea.  No PND, orthopnea. She may notice occasionally having heart rate increase at times, but nothing routine and nothing irregular.  No palpitations, lightheadedness, dizziness,  weakness or syncope/near syncope. No TIA/amaurosis fugax symptoms.  No claudication. PAD Screen 02/27/2018 02/27/2018  Previous PAD dx? No No  Previous surgical procedure? No No  Pain with walking? No No  Feet/toe relief with dangling? No No  Painful, non-healing ulcers? No No  Extremities discolored? No No    ROS: A comprehensive was performed. Review of Systems  Constitutional: Negative for malaise/fatigue.  HENT: Negative for congestion and nosebleeds.   Eyes: Negative for blurred vision, double vision and photophobia.  Respiratory: Negative for cough and shortness of breath.   Gastrointestinal: Negative for abdominal pain, blood in stool, heartburn and melena.  Genitourinary: Negative for hematuria.  Musculoskeletal: Positive for joint pain (Mild joint pains from being on her feet all day long.).  Neurological: Negative for dizziness, focal weakness and headaches.  Psychiatric/Behavioral: Negative.   All other systems reviewed and are negative.  I have reviewed and (if needed) personally updated the patient's problem list, medications, allergies, past medical and surgical history, social and family history.   Past Medical History:  Diagnosis Date  . Diabetes mellitus with neuropathy ([place])    On metformin  . Hyperlipidemia    On atorvastatin 20 mg  . Hypertension    On nifedipine 60 mg along with Lopressor 12.5 twice daily    Past Surgical History:  Procedure Laterality Date  . CATARACT EXTRACTION Left 12/19/2017    Current Meds  Medication Sig  . atorvastatin (LIPITOR) 20 MG tablet Take 20 mg by mouth  daily.  . Cholecalciferol (VITAMIN D-1000 MAX ST) 25 MCG (1000 UT) tablet Take by mouth.  . Cyanocobalamin (VITAMIN B 12 PO) Take 2 tablets by mouth daily.  . hydrochlorothiazide (HYDRODIURIL) 12.5 MG tablet TAKE TWO TABLETS BY MOUTH DAILY  . lisinopril (PRINIVIL,ZESTRIL) 10 MG tablet Take 1 tablet (10 mg total) by mouth daily.  . Melatonin 3 MG TABS 3 mg nightly.  .  metformin (FORTAMET) 1000 MG (OSM) 24 hr tablet Take 1 tablet (1,000 mg total) by mouth 2 (two) times daily with a meal.  . metoprolol tartrate (LOPRESSOR) 25 MG tablet TAKE ONE TABLET BY MOUTH TWICE A DAY (REPLACES LABETALOL)  . Misc Natural Products (GREEN TEA) TABS Take 2 tablets by mouth daily.  Marland Kitchen NIFEdipine (PROCARDIA XL/NIFEDICAL XL) 60 MG 24 hr tablet Take by mouth.  . [DISCONTINUED] diphenhydrAMINE (BENADRYL) 25 mg capsule Take 25 mg by mouth every 6 (six) hours as needed for sleep.    No Known Allergies  Social History   Tobacco Use  . Smoking status: Never Smoker  . Smokeless tobacco: Never Used  Substance Use Topics  . Alcohol use: Yes    Alcohol/week: 7.0 standard drinks    Types: 7 Glasses of wine per week    Comment: Maybe 1 glass of wine a week  . 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   Never smoked.  Enjoys a glass of wine a day.      Does not necessarily get routine exercise because she is tired and work.    family history includes Diabetes Mellitus II in her brother, father, and mother; Hypertension in her mother.  Wt Readings from Last 3 Encounters:  02/27/18 138 lb 12.8 oz (63 kg)  01/12/13 147 lb 9.6 oz (67 kg)  07/31/12 156 lb 3.2 oz (70.9 kg)    PHYSICAL EXAM BP (!) 174/83   Pulse 73   Ht 5\' 5"  (1.651 m)   Wt 138 lb 12.8 oz (63 kg)   BMI 23.10 kg/m   At PCP office, her blood pressure was 162/72, but that she said recently was 115/70. Physical Exam  Constitutional: She is oriented to person, place, and time. She appears well-developed and well-nourished. No distress.  Neck: Full passive range of motion without pain. Neck supple. Normal carotid pulses, no hepatojugular reflux and no JVD present. Carotid bruit is not present (Radiated aortic murmur).  Cardiovascular: Normal rate, regular rhythm, intact distal pulses and normal pulses.  No extrasystoles are present. PMI is  not displaced. Exam reveals no gallop and no friction rub.  Murmur heard.  Harsh crescendo-decrescendo early systolic murmur is present with a grade of 2/6 at the upper right sternal border radiating to the neck. Pulmonary/Chest: Effort normal and breath sounds normal. No respiratory distress. She has no wheezes. She has no rales.  Abdominal: Soft. Bowel sounds are normal. She exhibits no distension. There is no abdominal tenderness. There is no rebound.  No HSM  Musculoskeletal: Normal range of motion.        General: No edema (Trivial).  Neurological: She is alert and oriented to person, place, and time. No cranial nerve deficit.  Psychiatric: She has a normal mood and affect. Her behavior is normal. Judgment and thought content normal.  Vitals reviewed.    Adult ECG Report  Rate: 73 ;  Rhythm: normal sinus rhythm and Rightward axis of 97 degree.  T wave inversions in  3 and aVF.  Otherwise normal intervals and durations..;   Narrative Interpretation: Borderline EKG   Other studies Reviewed: Additional studies/ records that were reviewed today include:  Recent Labs: January 27, 2018: TC 176, HDL 83, LDL 79, TG 72.  A1c 5.9.  Hgb 9.3.  Cr 1.0, K+ 4.5   ASSESSMENT / PLAN: Problem List Items Addressed This Visit    Essential hypertension (Chronic)    Blood pressure is high today, she was quite stressed out getting into the visit having gotten lost driving in.  She is quite stressed out.  At this time I will simply continue her current meds, but will likely convert to carvedilol from metoprolol tartrate follow-up if her blood pressures are still high..      Relevant Medications   metoprolol tartrate (LOPRESSOR) 25 MG tablet   hydrochlorothiazide (HYDRODIURIL) 12.5 MG tablet   NIFEdipine (PROCARDIA XL/NIFEDICAL XL) 60 MG 24 hr tablet   Other Relevant Orders   EKG 12-Lead   ECHOCARDIOGRAM COMPLETE   Hyperlipidemia associated with type 2 diabetes mellitus (HCC) (Chronic)    Current  lipids look great on atorvastatin.  Followed by PCP.      Systolic ejection murmur - Primary (Chronic)    Systolic ejection murmur heard on exam was probably related to aortic valve pathology.  Based on the similar location I would suspect is probably aortic sclerosis, but we will check a 2D echocardiogram to confirm or deny any other potential severe pathology.      Relevant Orders   EKG 12-Lead   ECHOCARDIOGRAM COMPLETE       I spent a total of 30 minutes with the patient and chart review. >  50% of the time was spent in direct patient consultation.   Current medicines are reviewed at length with the patient today.  (+/- concerns) none The following changes have been made:  None  Patient Instructions  Medication Instructions:  NOT NEEDED If you need a refill on your cardiac medications before your next appointment, please call your pharmacy.   Lab work: NOT NEEDED If you have labs (blood work) drawn today and your tests are completely normal, you will receive your results only by: Marland Kitchen MyChart Message (if you have MyChart) OR . A paper copy in the mail If you have any lab test that is abnormal or we need to change your treatment, we will call you to review the results.  Testing/Procedures: SCHEDULE AT [place] has requested that you have an echocardiogram. Echocardiography is a painless test that uses sound waves to create images of your heart. It provides your doctor with information about the size and shape of your heart and how well your heart's chambers and valves are working. This procedure takes approximately one hour. There are no restrictions for this procedure.    Follow-Up: At [facility], you and your health needs are our priority.  As part of our continuing mission to provide you with exceptional heart care, we have created designated Provider Care Teams.  These Care Teams include your primary Cardiologist (physician) and  Advanced Practice Providers (APPs -  Physician Assistants and Nurse Practitioners) who all work together to provide you with the care you need, when you need it. . Your physician recommends that you schedule a follow-up appointment in 1 TO 2 MONTHS WITH DR HARDING .   Any Other Special Instructions Will Be Listed Below (If Applicable).       Studies Ordered:  Orders Placed This Encounter  Procedures  . EKG 12-Lead  . ECHOCARDIOGRAM COMPLETE      Glenetta Hew, M.D., M.S. Interventional Cardiologist   Pager # [phone] Phone # [phone] [address] [place], [place] [address]   Thank you for choosing [facility] at [facility]!!

## 2018-02-27 NOTE — Patient Instructions (Signed)
Medication Instructions:  NOT NEEDED If you need a refill on your cardiac medications before your next appointment, please call your pharmacy.   Lab work: NOT NEEDED If you have labs (blood work) drawn today and your tests are completely normal, you will receive your results only by: Marland Kitchen MyChart Message (if you have MyChart) OR . A paper copy in the mail If you have any lab test that is abnormal or we need to change your treatment, we will call you to review the results.  Testing/Procedures: SCHEDULE AT [place] has requested that you have an echocardiogram. Echocardiography is a painless test that uses sound waves to create images of your heart. It provides your doctor with information about the size and shape of your heart and how well your heart's chambers and valves are working. This procedure takes approximately one hour. There are no restrictions for this procedure.    Follow-Up: At [facility], you and your health needs are our priority.  As part of our continuing mission to provide you with exceptional heart care, we have created designated Provider Care Teams.  These Care Teams include your primary Cardiologist (physician) and Advanced Practice Providers (APPs -  Physician Assistants and Nurse Practitioners) who all work together to provide you with the care you need, when you need it. . Your physician recommends that you schedule a follow-up appointment in 1 TO 2 MONTHS WITH DR HARDING .   Any Other Special Instructions Will Be Listed Below (If Applicable).

## 2018-03-02 ENCOUNTER — Encounter: Payer: Self-pay | Admitting: Cardiology

## 2018-03-02 NOTE — Assessment & Plan Note (Signed)
Blood pressure is high today, she was quite stressed out getting into the visit having gotten lost driving in.  She is quite stressed out.  At this time I will simply continue her current meds, but will likely convert to carvedilol from metoprolol tartrate follow-up if her blood pressures are still high.Christine Strong

## 2018-03-02 NOTE — Assessment & Plan Note (Signed)
Systolic ejection murmur heard on exam was probably related to aortic valve pathology.  Based on the similar location I would suspect is probably aortic sclerosis, but we will check a 2D echocardiogram to confirm or deny any other potential severe pathology.

## 2018-03-02 NOTE — Assessment & Plan Note (Signed)
Current lipids look great on atorvastatin.  Followed by PCP.

## 2018-03-04 ENCOUNTER — Other Ambulatory Visit (HOSPITAL_COMMUNITY): Payer: Medicare HMO

## 2018-03-06 ENCOUNTER — Ambulatory Visit (HOSPITAL_COMMUNITY): Payer: Medicare HMO | Attending: Cardiovascular Disease

## 2018-03-06 DIAGNOSIS — R011 Cardiac murmur, unspecified: Secondary | ICD-10-CM | POA: Insufficient documentation

## 2018-03-06 DIAGNOSIS — D649 Anemia, unspecified: Secondary | ICD-10-CM | POA: Diagnosis not present

## 2018-03-06 DIAGNOSIS — I10 Essential (primary) hypertension: Secondary | ICD-10-CM | POA: Diagnosis not present

## 2018-03-06 HISTORY — PX: TRANSTHORACIC ECHOCARDIOGRAM: SHX275

## 2018-03-25 DIAGNOSIS — K580 Irritable bowel syndrome with diarrhea: Secondary | ICD-10-CM | POA: Diagnosis not present

## 2018-03-25 DIAGNOSIS — K2930 Chronic superficial gastritis without bleeding: Secondary | ICD-10-CM | POA: Diagnosis not present

## 2018-03-25 DIAGNOSIS — K649 Unspecified hemorrhoids: Secondary | ICD-10-CM | POA: Diagnosis not present

## 2018-03-25 DIAGNOSIS — B9681 Helicobacter pylori [H. pylori] as the cause of diseases classified elsewhere: Secondary | ICD-10-CM | POA: Diagnosis not present

## 2018-03-25 DIAGNOSIS — K621 Rectal polyp: Secondary | ICD-10-CM | POA: Diagnosis not present

## 2018-03-25 DIAGNOSIS — D125 Benign neoplasm of sigmoid colon: Secondary | ICD-10-CM | POA: Diagnosis not present

## 2018-03-25 DIAGNOSIS — D509 Iron deficiency anemia, unspecified: Secondary | ICD-10-CM | POA: Diagnosis not present

## 2018-03-27 DIAGNOSIS — H3411 Central retinal artery occlusion, right eye: Secondary | ICD-10-CM | POA: Diagnosis not present

## 2018-03-27 DIAGNOSIS — H35033 Hypertensive retinopathy, bilateral: Secondary | ICD-10-CM | POA: Diagnosis not present

## 2018-03-27 DIAGNOSIS — H35372 Puckering of macula, left eye: Secondary | ICD-10-CM | POA: Diagnosis not present

## 2018-03-27 DIAGNOSIS — E113313 Type 2 diabetes mellitus with moderate nonproliferative diabetic retinopathy with macular edema, bilateral: Secondary | ICD-10-CM | POA: Diagnosis not present

## 2018-03-27 DIAGNOSIS — E113312 Type 2 diabetes mellitus with moderate nonproliferative diabetic retinopathy with macular edema, left eye: Secondary | ICD-10-CM | POA: Diagnosis not present

## 2018-03-31 DIAGNOSIS — D125 Benign neoplasm of sigmoid colon: Secondary | ICD-10-CM | POA: Diagnosis not present

## 2018-03-31 DIAGNOSIS — B9681 Helicobacter pylori [H. pylori] as the cause of diseases classified elsewhere: Secondary | ICD-10-CM | POA: Diagnosis not present

## 2018-03-31 DIAGNOSIS — K2930 Chronic superficial gastritis without bleeding: Secondary | ICD-10-CM | POA: Diagnosis not present

## 2018-04-28 ENCOUNTER — Telehealth: Payer: Self-pay | Admitting: *Deleted

## 2018-04-28 NOTE — Telephone Encounter (Signed)
Left message to call back - need to schedule televisit for 4/23 with dr harding

## 2018-04-29 NOTE — Telephone Encounter (Signed)
Received message patient - would like to proceed with tele viist on 4/23.  unable to speak to patient - message left --  Be available for appt btwn 10 am- 11am on 4/23 With vitals and medication available She did not have to call back - just be available and by the phone for the appointment. - that registation will call to confirm.     TELEPHONE CALL NOTE  This patient has been deemed a candidate for follow-up tele-health visit to limit community exposure during the Covid-19 pandemic. I spoke with the patient via phone to discuss instructions. The patient will receive a phone call 2-3 days prior to their E-Visit at which time consent will be verbally confirmed.   A Virtual Office Visit appointment type has been scheduled for 4/23 AT 10 AM with HARDING, with  patient prefers telephone-- type.    Raiford Simmonds, RN 04/29/2018 9:27 AM

## 2018-04-30 ENCOUNTER — Telehealth: Payer: Self-pay | Admitting: Cardiology

## 2018-04-30 NOTE — Telephone Encounter (Signed)
Smartphone/ my chart/ virtual consent/ pre completed

## 2018-05-01 ENCOUNTER — Telehealth: Payer: Self-pay | Admitting: *Deleted

## 2018-05-01 ENCOUNTER — Ambulatory Visit: Payer: Medicare HMO | Admitting: Cardiology

## 2018-05-01 ENCOUNTER — Telehealth (INDEPENDENT_AMBULATORY_CARE_PROVIDER_SITE_OTHER): Payer: Medicare HMO | Admitting: Cardiology

## 2018-05-01 ENCOUNTER — Encounter: Payer: Self-pay | Admitting: Cardiology

## 2018-05-01 VITALS — BP 128/69 | HR 79 | Ht 65.5 in | Wt 140.0 lb

## 2018-05-01 DIAGNOSIS — E785 Hyperlipidemia, unspecified: Secondary | ICD-10-CM

## 2018-05-01 DIAGNOSIS — E1169 Type 2 diabetes mellitus with other specified complication: Secondary | ICD-10-CM

## 2018-05-01 DIAGNOSIS — I350 Nonrheumatic aortic (valve) stenosis: Secondary | ICD-10-CM

## 2018-05-01 DIAGNOSIS — I10 Essential (primary) hypertension: Secondary | ICD-10-CM

## 2018-05-01 NOTE — Assessment & Plan Note (Signed)
Last lipid check looked pretty well controlled on current dose of statin.  Managed by PCP.

## 2018-05-01 NOTE — Progress Notes (Signed)
Virtual Visit via Telephone Note   This visit type was conducted due to national recommendations for restrictions regarding the COVID-19 Pandemic (e.g. social distancing) in an effort to limit this patient's exposure and mitigate transmission in our community.  Due to her co-morbid illnesses, this patient is at least at moderate risk for complications without adequate follow up.  This format is felt to be most appropriate for this patient at this time.  The patient did not have access to video technology/had technical difficulties with video requiring transitioning to audio format only (telephone).  All issues noted in this document were discussed and addressed.  No physical exam could be performed with this format.  Please refer to the patient's chart for her  consent to telehealth for [facility].   Patient has given verbal permission to conduct this visit via virtual appointment and to bill insurance 05/01/2018 1:02 PM     Evaluation Performed:  Follow-up visit  Date:  05/01/2018   ID:  Christine Strong, DOB Oct 31, 1950, MRN [id number]  Patient Location: Home Provider Location: Home  PCP:  Aretta Nip, MD  Cardiologist:  Glenetta Hew, MD  Electrophysiologist:  None   Chief Complaint: 12-month follow-up  History of Present Illness:    Christine Strong is a 68 y.o. female with PMH notable for hypertension, hyperlipidemia, DM-2 who was initially seen back in February for evaluation of a heart murmur.  Christine Strong now presents via Engineer, civil (consulting) for a telehealth visit today to discuss the results of her echocardiogram..  Interval History:  Doing well from a CV perspective.   Staying active -- not aggressive, but at least 30 min/day.   Cardiovascular ROS: no chest pain or dyspnea on exertion negative for - edema, irregular heartbeat, orthopnea, palpitations, paroxysmal nocturnal dyspnea, rapid heart rate or shortness of breath; syncope/near syncope or TIA symptoms fugax   The patient does not have symptoms concerning for COVID-19 infection (fever, chills, cough, or new shortness of breath).  The patient is practicing social distancing.  ROS:  Please see the history of present illness.      Past Medical History:  Diagnosis Date  . Diabetes mellitus with neuropathy ([place])    On metformin  . Hyperlipidemia    On atorvastatin 20 mg  . Hypertension    On nifedipine 60 mg along with Lopressor 12.5 twice daily  . Mild aortic stenosis by prior echocardiogram 02/2018   Moderate aortic valve thickening-mild stenosis (mean gradient 9.3 mmHg)   Past Surgical History:  Procedure Laterality Date  . CATARACT EXTRACTION Left 12/19/2017  . TRANSTHORACIC ECHOCARDIOGRAM  03/06/2018   Mildly increased LV thickness.  EF 60-65%.  Now RWMA (read as restrictive filling pattern--on my review this is clearly incorrect, is more grade 1 diastolic dysfunction).  Mild LA dilation.  Moderate mitral valve thickening and calcification but no stenosis or regurgitation.  Moderate aortic valve thickening-mild aortic stenosis.     Current Meds  Medication Sig  . atorvastatin (LIPITOR) 20 MG tablet Take 20 mg by mouth daily.  . Cholecalciferol (VITAMIN D-1000 MAX ST) 25 MCG (1000 UT) tablet Take by mouth.  . Cyanocobalamin (VITAMIN B 12 PO) Take 2 tablets by mouth daily.  . hydrochlorothiazide (HYDRODIURIL) 12.5 MG tablet TAKE TWO TABLETS BY MOUTH DAILY  . lisinopril (PRINIVIL,ZESTRIL) 10 MG tablet Take 1 tablet (10 mg total) by mouth daily.  . Melatonin 3 MG TABS 3 mg nightly.  . metformin (FORTAMET) 1000 MG (OSM) 24 hr tablet Take 1 tablet (  1,000 mg total) by mouth 2 (two) times daily with a meal.  . metoprolol tartrate (LOPRESSOR) 25 MG tablet TAKE ONE TABLET BY MOUTH TWICE A DAY (REPLACES LABETALOL)  . NIFEdipine (PROCARDIA XL/NIFEDICAL XL) 60 MG 24 hr tablet Take by mouth.  . pantoprazole (PROTONIX) 40 MG tablet 40 mg daily.      Allergies:   Patient has no known allergies.    Social History   Tobacco Use  . Smoking status: Never Smoker  . Smokeless tobacco: Never Used  Substance Use Topics  . Alcohol use: Yes    Alcohol/week: 7.0 standard drinks    Types: 7 Glasses of wine per week    Comment: Maybe 1 glass of wine a week  . Drug use: Never     Family Hx: The patient's family history includes Diabetes Mellitus II in her brother, father, and mother; Hypertension in her mother.   Prior CV studies:   The following studies were reviewed today: . Echocardiogram 03/06/2018: Mildly increased LV thickness.  EF 60-65%.  Now RWMA (read as restrictive filling pattern--on my review this is clearly incorrect, is more grade 1 diastolic dysfunction).  Mild LA dilation.  Moderate mitral valve thickening and calcification but no stenosis or regurgitation.  Moderate aortic valve thickening-mild aortic stenosis.  Labs/Other Tests and Data Reviewed:    EKG:  No ECG reviewed.  Recent Labs: No results found for requested labs within last 8760 hours.   Recent Lipid Panel No results found for: CHOL, TRIG, HDL, CHOLHDL, LDLCALC, LDLDIRECT  Wt Readings from Last 3 Encounters:  05/01/18 140 lb (63.5 kg)  02/27/18 138 lb 12.8 oz (63 kg)  01/12/13 147 lb 9.6 oz (67 kg)     Objective:    Vital Signs:  BP 128/69   Pulse 79   Ht 5' 5.5" (1.664 m)   Wt 140 lb (63.5 kg)   BMI 22.94 kg/m   VITAL SIGNS:  reviewed RESPIRATORY:  non-labored NEURO:  alert and oriented x 3, no obvious focal deficit PSYCH:  normal affect   ASSESSMENT & PLAN:    Problem List Items Addressed This Visit    Essential hypertension (Chronic)    Blood pressure much better today.  Not as stressed.  Happy to hear good results. On stable medications.  Managed by PCP.      Relevant Orders   ECHOCARDIOGRAM COMPLETE   Hyperlipidemia associated with type 2 diabetes mellitus (HCC) (Chronic)    Last lipid check looked pretty well controlled on current dose of statin.  Managed by PCP.      Mild  aortic stenosis by prior echocardiogram - Primary (Chronic)    Seen for systolic ejection murmur evaluation.  My initial thought was that it was probably aortic valve related and indeed it was.  There is aortic sclerosis but also a mild stenosis gradient.  Very mild with mean gradient of less than 10 mmHg.  We discussed pathophysiology of aortic sclerosis and stenosis.  At this point we will simply follow-up with an echocardiogram in 2 years. Discussed importance of blood pressure, diabetes and lipid management which I will defer to PCP.      Relevant Orders   ECHOCARDIOGRAM COMPLETE      COVID-19 Education: The signs and symptoms of COVID-19 were discussed with the patient and how to seek care for testing (follow up with PCP or arrange E-visit).   The importance of social distancing was discussed today.  Time:   Today, I have spent 12 minutes  with the patient with telehealth technology discussing the above problems.     Medication Adjustments/Labs and Tests Ordered: Current medicines are reviewed at length with the patient today.  Concerns regarding medicines are outlined above.  Medication Instructions:   Continue current home medications for blood pressure, diabetes and cholesterol.  Tests Ordered: Orders Placed This Encounter  Procedures  . ECHOCARDIOGRAM COMPLETE    Medication Changes: No orders of the defined types were placed in this encounter.   Disposition:  Follow up in 2 year(s)    Signed, Glenetta Hew, MD  05/01/2018 1:02 PM    [facility]

## 2018-05-01 NOTE — Patient Instructions (Addendum)
Medication Instructions:   Continue current home medications for blood pressure, diabetes and cholesterol.  If you need a refill on your cardiac medications before your next appointment, please call your pharmacy.   Lab work: Not needed   Testing/Procedures: --We will recheck a 2D echocardiogram in 2 year (FEB-2022 )- can order prior to 2 yr f/u.   Will schedule at [facility] [address] [place] has requested that you have an echocardiogram. Echocardiography is a painless test that uses sound waves to create images of your heart. It provides your doctor with information about the size and shape of your heart and how well your heart's chambers and valves are working. This procedure takes approximately one hour. There are no restrictions for this procedure.    Follow-Up: At [facility], you and your health needs are our priority.  As part of our continuing mission to provide you with exceptional heart care, we have created designated Provider Care Teams.  These Care Teams include your primary Cardiologist (physician) and Advanced Practice Providers (APPs -  Physician Assistants and Nurse Practitioners) who all work together to provide you with the care you need, when you need it. You will need a follow up appointment in  2 years  April 2022.   Please call our office 2 months in advance to schedule this appointment.  You may see Glenetta Hew, MD  or one of the following Advanced Practice Providers on your designated Care Team:   Rosaria Ferries, PA-C . Jory Sims, DNP, ANP  Any Other Special Instructions Will Be Listed Below (If Applicable).

## 2018-05-01 NOTE — Telephone Encounter (Signed)
LEFT DETAIL MESSAGE CONCERNING INSTRUCTION FROM TELE-VIST 4/23 WITH DR HARDING.  AVS SUMMARY WILL BE MAILED TO PATIENT

## 2018-05-01 NOTE — Assessment & Plan Note (Signed)
Blood pressure much better today.  Not as stressed.  Happy to hear good results. On stable medications.  Managed by PCP.

## 2018-05-01 NOTE — Assessment & Plan Note (Signed)
Seen for systolic ejection murmur evaluation.  My initial thought was that it was probably aortic valve related and indeed it was.  There is aortic sclerosis but also a mild stenosis gradient.  Very mild with mean gradient of less than 10 mmHg.  We discussed pathophysiology of aortic sclerosis and stenosis.  At this point we will simply follow-up with an echocardiogram in 2 years. Discussed importance of blood pressure, diabetes and lipid management which I will defer to PCP.

## 2018-05-18 DIAGNOSIS — D649 Anemia, unspecified: Secondary | ICD-10-CM | POA: Diagnosis not present

## 2018-05-18 DIAGNOSIS — Z86010 Personal history of colonic polyps: Secondary | ICD-10-CM | POA: Diagnosis not present

## 2018-05-18 DIAGNOSIS — A048 Other specified bacterial intestinal infections: Secondary | ICD-10-CM | POA: Diagnosis not present

## 2018-05-19 DIAGNOSIS — D631 Anemia in chronic kidney disease: Secondary | ICD-10-CM | POA: Diagnosis not present

## 2018-05-19 DIAGNOSIS — D649 Anemia, unspecified: Secondary | ICD-10-CM | POA: Diagnosis not present

## 2018-05-19 DIAGNOSIS — E1122 Type 2 diabetes mellitus with diabetic chronic kidney disease: Secondary | ICD-10-CM | POA: Diagnosis not present

## 2018-05-19 DIAGNOSIS — R801 Persistent proteinuria, unspecified: Secondary | ICD-10-CM | POA: Diagnosis not present

## 2018-05-19 DIAGNOSIS — N182 Chronic kidney disease, stage 2 (mild): Secondary | ICD-10-CM | POA: Diagnosis not present

## 2018-05-19 DIAGNOSIS — E559 Vitamin D deficiency, unspecified: Secondary | ICD-10-CM | POA: Diagnosis not present

## 2018-05-19 DIAGNOSIS — Z7984 Long term (current) use of oral hypoglycemic drugs: Secondary | ICD-10-CM | POA: Diagnosis not present

## 2018-05-19 DIAGNOSIS — I129 Hypertensive chronic kidney disease with stage 1 through stage 4 chronic kidney disease, or unspecified chronic kidney disease: Secondary | ICD-10-CM | POA: Diagnosis not present

## 2018-06-17 DIAGNOSIS — A048 Other specified bacterial intestinal infections: Secondary | ICD-10-CM | POA: Diagnosis not present

## 2018-06-17 DIAGNOSIS — Z86010 Personal history of colonic polyps: Secondary | ICD-10-CM | POA: Diagnosis not present

## 2018-06-17 DIAGNOSIS — D649 Anemia, unspecified: Secondary | ICD-10-CM | POA: Diagnosis not present

## 2018-07-03 DIAGNOSIS — D649 Anemia, unspecified: Secondary | ICD-10-CM | POA: Diagnosis not present

## 2018-07-21 DIAGNOSIS — H40052 Ocular hypertension, left eye: Secondary | ICD-10-CM | POA: Diagnosis not present

## 2018-07-21 DIAGNOSIS — E113313 Type 2 diabetes mellitus with moderate nonproliferative diabetic retinopathy with macular edema, bilateral: Secondary | ICD-10-CM | POA: Diagnosis not present

## 2018-07-21 DIAGNOSIS — H35033 Hypertensive retinopathy, bilateral: Secondary | ICD-10-CM | POA: Diagnosis not present

## 2018-07-21 DIAGNOSIS — Z961 Presence of intraocular lens: Secondary | ICD-10-CM | POA: Diagnosis not present

## 2018-07-21 DIAGNOSIS — H3411 Central retinal artery occlusion, right eye: Secondary | ICD-10-CM | POA: Diagnosis not present

## 2018-07-21 DIAGNOSIS — E1165 Type 2 diabetes mellitus with hyperglycemia: Secondary | ICD-10-CM | POA: Diagnosis not present

## 2018-08-18 DIAGNOSIS — D649 Anemia, unspecified: Secondary | ICD-10-CM | POA: Diagnosis not present

## 2018-08-31 ENCOUNTER — Telehealth: Payer: Self-pay | Admitting: Hematology

## 2018-08-31 NOTE — Telephone Encounter (Signed)
Received a new hem referral from [facility] for persistent anemia. Ms. Christine Strong has been cld and scheduled to Dr. Irene Limbo on 9/14 at 11am. She's been made aware to arrive 15 minutes early

## 2018-09-09 DIAGNOSIS — A048 Other specified bacterial intestinal infections: Secondary | ICD-10-CM | POA: Diagnosis not present

## 2018-09-09 DIAGNOSIS — Z86010 Personal history of colonic polyps: Secondary | ICD-10-CM | POA: Diagnosis not present

## 2018-09-09 DIAGNOSIS — D649 Anemia, unspecified: Secondary | ICD-10-CM | POA: Diagnosis not present

## 2018-09-20 NOTE — Progress Notes (Signed)
HEMATOLOGY/ONCOLOGY CONSULTATION NOTE  Date of Service: 09/20/2018  Patient Care Team: Rankins, Bill Salinas, MD as PCP - General (Family Medicine) Leonie Man, MD as PCP - Cardiology (Cardiology)  CHIEF COMPLAINTS/PURPOSE OF CONSULTATION:  Anemia  HISTORY OF PRESENTING ILLNESS:   Christine Strong is a wonderful 68 y.o. female who has been referred to [place] by Dr Alessandra Bevels for evaluation and management of anemia. The pt reports that she is doing well overall.  The pt reports that she went to the Nephrologist near the beginning of the year and that's when he brought up his concern about her anemia. She reports that he thought that her anemia was due to her Eylea shots, which are only intraoccular injections. She has been receiving these since 2017. Dr Alessandra Bevels, her GI, thought that her anemia could have been due to her Helicobacter pylori infection. She completed her antibiotics and denies having a second stool test to confirm the absence of Helicobacter pylori. Her abdominal pain from the infection has resolved.   Pt reports that her Nephrologist thinks that her proteinuria is associated with her diabetes. Pt has been regularly taking her Metformin for her diabetes, which was diagnosed in 2016. She does not have to take insulin and it is currently well controlled. She is already on a Vitamin B12 replacement which she takes intermittently. Pt is unsure if she was actually Vitamin B12 deficient but notes that supplementation did improve her energy. Pt is currently taking a PO iron supplement. She has been on an acid suppressant since the discovery of her Helicobacter pylori infection.  She has noticed some increased fatigue compared to 6 months ago but also notes that she has significanly lowered her intake of caffeine in the last 6 months. She has been walking more lately which has been helping with her energy levels. Pt reports that she has been feeling more depressed due to isolation  caused by imposed social distancing. She is up to date on her age appropriate cancer screenings.   Pt reports that at her heaviest she used to drink 2-3 glasses of wine per day but that she is now down to 4-5 glasses per week.  Of note prior to the patient's visit today, pt has had EGD completed on 03/25/2018 with results revealing "1. Sm Intestine-Duodenum, Bx: SMALL BOWEL MUCOSA WITH NO SIGNIFICANT PATHOLOGIC CHANGES. Negative for features of Celiac disease. Giardia lamblia organisms not present. 2. Stomach - Antrum, Bx: ACTIVE CHRONIC HELICOBACTER PYLORI ASSOCIATED GASTRITIS. POSITIVE for Helicobacter pylori organisms on IP stain. 3. LG Intestine, Random Colon Bx: COLONIC MUCOSA WITH NO SIGNIFICANT PATHOLOGIC CHANGES. Negative for lymphocytic/collagenous colitis, active colitis or dysplasia. 4. LG Intestine- Descending Colon, Rectum, Polyp: TUBULAR ADENOMAS (2)."  Pt has had Colonoscopy completed on 03/25/2018 with results revealing " -Hemorrhoids found on perianal exam. - The examined portion of the ileum was normal. - Normal mucose in the entire examined colon, Biopsied. - One 4 mm polyp in the descending colon, removed with cold snare. Resected and retrieved. - One 8 mm polyp in the rectum, removed with a cold snare. Resected and removed. - Significant colonic spasm."  Most recent lab results (08/18/2018) of CBC w/diff is as follows: WBC at 5.8K, RBC at 2.96, Hgb at 9.0, HCT at 27.1, MCV at 91.5, MCH at 30.5, MCHC at 33.4, RDW at 13.6, Platelets at 476K, MPV at 7.0, Neutro Rel at 65.9, Lymphs Rel at 24.1, Mono Rel at 7.9, Eos Rel at 1.1, Baso Rel at 1.0, Neutro  Abs at 3.8K, Lymphs Abs at 1.40, Mono Abs at 0.5  On review of systems, pt reports fatigue, depression and denies fevers, chills, unexpected night sweats, unexpected weight loss, mouth sores, new bone pains, abdominal pain, back pain, skin rashes and any other symptoms.   On PMHx the pt reports Diabetes, HTN, HLD, central retinal artery  occlusion (right). On Social Hx the pt reports non smoker, 4-5 glasses of wine per week. On Family Hx the pt reports father who passed from lung cancer.  MEDICAL HISTORY:  Past Medical History:  Diagnosis Date  . Diabetes mellitus with neuropathy ([place])    On metformin  . Hyperlipidemia    On atorvastatin 20 mg  . Hypertension    On nifedipine 60 mg along with Lopressor 12.5 twice daily  . Mild aortic stenosis by prior echocardiogram 02/2018   Moderate aortic valve thickening-mild stenosis (mean gradient 9.3 mmHg)    SURGICAL HISTORY: Past Surgical History:  Procedure Laterality Date  . CATARACT EXTRACTION Left 12/19/2017  . TRANSTHORACIC ECHOCARDIOGRAM  03/06/2018   Mildly increased LV thickness.  EF 60-65%.  Now RWMA (read as restrictive filling pattern--on my review this is clearly incorrect, is more grade 1 diastolic dysfunction).  Mild LA dilation.  Moderate mitral valve thickening and calcification but no stenosis or regurgitation.  Moderate aortic valve thickening-mild aortic stenosis.    SOCIAL HISTORY: Social History   Socioeconomic History  . Marital status: Divorced    Spouse name: Not on file  . Number of children: 0  . Years of education: Not on file  . Highest education level: Not on file  Occupational History  . Occupation: Passenger transport manager: [employer]  Social Needs  . Financial resource strain: Not on file  . Food insecurity    Worry: Not on file    Inability: Not on file  . Transportation needs    Medical: Not on file    Non-medical: Not on file  Tobacco Use  . Smoking status: Never Smoker  . Smokeless tobacco: Never Used  Substance and Sexual Activity  . Alcohol use: Yes    Alcohol/week: 7.0 standard drinks    Types: 7 Glasses of wine per week    Comment: Maybe 1 glass of wine a week  . Drug use: Never  . Sexual activity: Not on file  Lifestyle  . Physical activity    Days per week: Not on file    Minutes per session: Not on  file  . Stress: Not on file  Relationships  . Social Herbalist on phone: Not on file    Gets together: Not on file    Attends religious service: Not on file    Active member of club or organization: Not on file    Attends meetings of clubs or organizations: Not on file    Relationship status: Not on file  . Intimate partner violence    Fear of current or ex partner: Not on file    Emotionally abused: Not on file    Physically abused: Not on file    Forced sexual activity: Not on file  Other Topics Concern  . 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   Never smoked.  Enjoys a glass of wine a day.      Does not necessarily get routine exercise because she is tired and work.    FAMILY HISTORY: Family History  Problem  Relation Age of Onset  . Diabetes Mellitus II Mother   . Hypertension Mother   . Diabetes Mellitus II Father   . Diabetes Mellitus II Brother     ALLERGIES:  has No Known Allergies.  MEDICATIONS:  Current Outpatient Medications  Medication Sig Dispense Refill  . atorvastatin (LIPITOR) 20 MG tablet Take 20 mg by mouth daily.    . Cholecalciferol (VITAMIN D-1000 MAX ST) 25 MCG (1000 UT) tablet Take by mouth.    . Cyanocobalamin (VITAMIN B 12 PO) Take 2 tablets by mouth daily.    . hydrochlorothiazide (HYDRODIURIL) 12.5 MG tablet TAKE TWO TABLETS BY MOUTH DAILY    . lisinopril (PRINIVIL,ZESTRIL) 10 MG tablet Take 1 tablet (10 mg total) by mouth daily. 30 tablet 0  . Melatonin 3 MG TABS 3 mg nightly.    . metformin (FORTAMET) 1000 MG (OSM) 24 hr tablet Take 1 tablet (1,000 mg total) by mouth 2 (two) times daily with a meal. 60 tablet 0  . metoprolol tartrate (LOPRESSOR) 25 MG tablet TAKE ONE TABLET BY MOUTH TWICE A DAY (REPLACES LABETALOL)    . NIFEdipine (PROCARDIA XL/NIFEDICAL XL) 60 MG 24 hr tablet Take by mouth.    . pantoprazole (PROTONIX) 40 MG tablet 40 mg daily.      No current  facility-administered medications for this visit.     REVIEW OF SYSTEMS:    10 Point review of Systems was done is negative except as noted above.  PHYSICAL EXAMINATION: ECOG PERFORMANCE STATUS: 1 - Symptomatic but completely ambulatory  . Vitals:   09/21/18 1102  BP: 133/70  Pulse: 68  Resp: 17  Temp: 98.2 F (36.8 C)  SpO2: 100%   Filed Weights   09/21/18 1102  Weight: 136 lb 8 oz (61.9 kg)   .Body mass index is 22.37 kg/m.  GENERAL:alert, in no acute distress and comfortable SKIN: no acute rashes, no significant lesions EYES: conjunctiva are pink and non-injected, sclera anicteric OROPHARYNX: MMM, no exudates, no oropharyngeal erythema or ulceration NECK: supple, no JVD LYMPH:  no palpable lymphadenopathy in the cervical, axillary or inguinal regions LUNGS: clear to auscultation b/l with normal respiratory effort HEART: regular rate & rhythm ABDOMEN:  normoactive bowel sounds , non tender, not distended. Extremity: no pedal edema PSYCH: alert & oriented x 3 with fluent speech NEURO: no focal motor/sensory deficits  LABORATORY DATA:  I have reviewed the data as listed  .No flowsheet data found.  . CBC Latest Ref Rng & Units 09/21/2018 09/21/2018  WBC 4.0 - 10.5 K/uL 5.5 -  Hemoglobin 12.0 - 15.0 g/dL 9.3(L) -  Hematocrit 34.0 - 46.6 % 28.2(L) 28.2(L)  Platelets 150 - 400 K/uL 347 -   . CBC    Component Value Date/Time   WBC 5.5 09/21/2018 1213   RBC 2.95 (L) 09/21/2018 1213   RBC 3.03 (L) 09/21/2018 1213   HGB 9.3 (L) 09/21/2018 1213   HCT 28.2 (L) 09/21/2018 1213   HCT 28.2 (L) 09/21/2018 1213   PLT 347 09/21/2018 1213   MCV 93.1 09/21/2018 1213   MCH 30.7 09/21/2018 1213   MCHC 33.0 09/21/2018 1213   RDW 13.0 09/21/2018 1213   LYMPHSABS 1.5 09/21/2018 1213   MONOABS 0.5 09/21/2018 1213   EOSABS 0.1 09/21/2018 1213   BASOSABS 0.0 09/21/2018 1213    . CMP Latest Ref Rng & Units 09/21/2018 12/05/2013  Glucose 70 - 99 mg/dL 100(H) 117(H)  BUN 8  - 23 mg/dL 53(H) 21  Creatinine 0.44 - 1.00  mg/dL 1.68(H) 0.57  Sodium 135 - 145 mmol/L 139 140  Potassium 3.5 - 5.1 mmol/L 4.9 4.9  Chloride 98 - 111 mmol/L 107 102  CO2 22 - 32 mmol/L 22 24  Calcium 8.9 - 10.3 mg/dL 9.5 9.7  Total Protein 6.5 - 8.1 g/dL 7.6 -  Total Bilirubin 0.3 - 1.2 mg/dL 0.4 -  Alkaline Phos 38 - 126 U/L 49 -  AST 15 - 41 U/L 13(L) -  ALT 0 - 44 U/L 9 -   . Lab Results  Component Value Date   IRON 92 09/21/2018   TIBC 276 09/21/2018   IRONPCTSAT 33 09/21/2018   (Iron and TIBC)  Lab Results  Component Value Date   FERRITIN 79 09/21/2018    03/25/2018 Colonoscopy    03/25/2018 EDG    RADIOGRAPHIC STUDIES: I have personally reviewed the radiological images as listed and agreed with the findings in the report. No results found.  ASSESSMENT & PLAN:   1) Normocytic Anemia  PLAN -Discussed patient's most recent labs from 08/18/2018,  WBC at 5.8K, RBC at 2.96, Hgb at 9.0, HCT at 27.1, MCV at 91.5, MCH at 30.5, MCHC at 33.4, RDW at 13.6, Platelets at 476K, MPV at 7.0, Neutro Rel at 65.9, Lymphs Rel at 24.1, Mono Rel at 7.9, Eos Rel at 1.1, Baso Rel at 1.0, Neutro Abs at 3.8K, Lymphs Abs at 1.40, Mono Abs at 0.5 -Discussed 03/25/2018  EGD which revealed "1. Sm Intestine-Duodenum, Bx: SMALL BOWEL MUCOSA WITH NO SIGNIFICANT PATHOLOGIC CHANGES. Negative for features of Celiac disease. Giardia lamblia organisms not present. 2. Stomach - Antrum, Bx: ACTIVE CHRONIC HELICOBACTER PYLORI ASSOCIATED GASTRITIS. POSITIVE for Helicobacter pylori organisms on IP stain. 3. LG Intestine, Random Colon Bx: COLONIC MUCOSA WITH NO SIGNIFICANT PATHOLOGIC CHANGES. Negative for lymphocytic/collagenous colitis, active colitis or dysplasia. 4. LG Intestine- Descending Colon, Rectum, Polyp: TUBULAR ADENOMAS (2)." -Discussed 03/25/2018 Colonoscopy which revealed " -Hemorrhoids found on perianal exam. - The examined portion of the ileum was normal. - Normal mucose in the entire examined  colon, Biopsied. - One 4 mm polyp in the descending colon, removed with cold snare. Resected and retrieved. - One 8 mm polyp in the rectum, removed with a cold snare. Resected and removed. - Significant colonic spasm." -Discussed with pt that her anemia could be due to her bone marrow not making enough RBC or hemolysis  -Will need additional testing to r/o paraproteinemia & elucidate the cause of macrocytosis -Plan for pt to continue to take PO Iron -Will get labs today  -Will see back in 2 weeks via phone   FOLLOW UP: Labs today Phone visit with Dr Irene Limbo in 2 weeks  . Orders Placed This Encounter  Procedures  . CBC with Differential/Platelet    Standing Status:   Future    Number of Occurrences:   1    Standing Expiration Date:   10/26/2019  . CMP ([facility] only)    Standing Status:   Future    Number of Occurrences:   1    Standing Expiration Date:   09/21/2019  . Multiple Myeloma Panel (SPEP&IFE w/QIG)    Standing Status:   Future    Number of Occurrences:   1    Standing Expiration Date:   09/21/2019  . Kappa/lambda light chains    Standing Status:   Future    Number of Occurrences:   1    Standing Expiration Date:   10/26/2019  . Lactate dehydrogenase    Standing  Status:   Future    Number of Occurrences:   1    Standing Expiration Date:   09/21/2019  . Erythropoietin    Standing Status:   Future    Number of Occurrences:   1    Standing Expiration Date:   09/21/2019  . Sedimentation rate    Standing Status:   Future    Number of Occurrences:   1    Standing Expiration Date:   09/21/2019  . Haptoglobin    Standing Status:   Future    Number of Occurrences:   1    Standing Expiration Date:   09/21/2019  . TSH    Standing Status:   Future    Number of Occurrences:   1    Standing Expiration Date:   09/21/2019  . Ferritin    Standing Status:   Future    Number of Occurrences:   1    Standing Expiration Date:   09/21/2019  . Iron and TIBC    Standing Status:    Future    Number of Occurrences:   1    Standing Expiration Date:   09/21/2019  . Vitamin B12    Standing Status:   Future    Number of Occurrences:   1    Standing Expiration Date:   09/21/2019  . Folate RBC    Standing Status:   Future    Number of Occurrences:   1    Standing Expiration Date:   09/21/2019  . Reticulocytes    Standing Status:   Future    Number of Occurrences:   1    Standing Expiration Date:   09/21/2019    All of the patients questions were answered with apparent satisfaction. The patient knows to call the clinic with any problems, questions or concerns.  I spent 4mns counseling the patient face to face. The total time spent in the appointment was 437ms and more than 50% was on counseling and direct patient cares.    GaSullivan LoneD MSWilmotAHIVMS SCCity Hospital At White RockTMankato Surgery Centerematology/Oncology Physician CoEndoscopy Center Of Santa Monica(Office):       [phone] cell):  [phone]):           [phone]/13/2020 11:02 PM  I, JaYevette Edwardsam acting as a scribe for Dr. GaSullivan Lone  .I have reviewed the above documentation for accuracy and completeness, and I agree with the above. .GBrunetta GeneraD

## 2018-09-21 ENCOUNTER — Telehealth: Payer: Self-pay | Admitting: Hematology

## 2018-09-21 ENCOUNTER — Other Ambulatory Visit: Payer: Self-pay

## 2018-09-21 ENCOUNTER — Inpatient Hospital Stay: Payer: Medicare HMO | Attending: Hematology | Admitting: Hematology

## 2018-09-21 ENCOUNTER — Inpatient Hospital Stay: Payer: Medicare HMO

## 2018-09-21 VITALS — BP 133/70 | HR 68 | Temp 98.2°F | Resp 17 | Ht 65.5 in | Wt 136.5 lb

## 2018-09-21 DIAGNOSIS — Z7984 Long term (current) use of oral hypoglycemic drugs: Secondary | ICD-10-CM

## 2018-09-21 DIAGNOSIS — E119 Type 2 diabetes mellitus without complications: Secondary | ICD-10-CM | POA: Diagnosis not present

## 2018-09-21 DIAGNOSIS — R809 Proteinuria, unspecified: Secondary | ICD-10-CM

## 2018-09-21 DIAGNOSIS — Z79899 Other long term (current) drug therapy: Secondary | ICD-10-CM | POA: Diagnosis not present

## 2018-09-21 DIAGNOSIS — D649 Anemia, unspecified: Secondary | ICD-10-CM | POA: Diagnosis not present

## 2018-09-21 DIAGNOSIS — E785 Hyperlipidemia, unspecified: Secondary | ICD-10-CM | POA: Insufficient documentation

## 2018-09-21 DIAGNOSIS — R69 Illness, unspecified: Secondary | ICD-10-CM | POA: Diagnosis not present

## 2018-09-21 DIAGNOSIS — I10 Essential (primary) hypertension: Secondary | ICD-10-CM | POA: Diagnosis not present

## 2018-09-21 DIAGNOSIS — F329 Major depressive disorder, single episode, unspecified: Secondary | ICD-10-CM | POA: Diagnosis not present

## 2018-09-21 LAB — CMP (CANCER CENTER ONLY)
ALT: 9 U/L (ref 0–44)
AST: 13 U/L — ABNORMAL LOW (ref 15–41)
Albumin: 4 g/dL (ref 3.5–5.0)
Alkaline Phosphatase: 49 U/L (ref 38–126)
Anion gap: 10 (ref 5–15)
BUN: 53 mg/dL — ABNORMAL HIGH (ref 8–23)
CO2: 22 mmol/L (ref 22–32)
Calcium: 9.5 mg/dL (ref 8.9–10.3)
Chloride: 107 mmol/L (ref 98–111)
Creatinine: 1.68 mg/dL — ABNORMAL HIGH (ref 0.44–1.00)
GFR, Est AFR Am: 36 mL/min — ABNORMAL LOW (ref >60)
GFR, Estimated: 31 mL/min — ABNORMAL LOW (ref >60)
Glucose, Bld: 100 mg/dL — ABNORMAL HIGH (ref 70–99)
Potassium: 4.9 mmol/L (ref 3.5–5.1)
Sodium: 139 mmol/L (ref 135–145)
Total Bilirubin: 0.4 mg/dL (ref 0.3–1.2)
Total Protein: 7.6 g/dL (ref 6.5–8.1)

## 2018-09-21 LAB — CBC WITH DIFFERENTIAL/PLATELET
Abs Immature Granulocytes: 0.01 10*3/uL (ref 0.00–0.07)
Basophils Absolute: 0 10*3/uL (ref 0.0–0.1)
Basophils Relative: 0 %
Eosinophils Absolute: 0.1 10*3/uL (ref 0.0–0.5)
Eosinophils Relative: 1 %
HCT: 28.2 % — ABNORMAL LOW (ref 36.0–46.0)
Hemoglobin: 9.3 g/dL — ABNORMAL LOW (ref 12.0–15.0)
Immature Granulocytes: 0 %
Lymphocytes Relative: 28 %
Lymphs Abs: 1.5 10*3/uL (ref 0.7–4.0)
MCH: 30.7 pg (ref 26.0–34.0)
MCHC: 33 g/dL (ref 30.0–36.0)
MCV: 93.1 fL (ref 80.0–100.0)
Monocytes Absolute: 0.5 10*3/uL (ref 0.1–1.0)
Monocytes Relative: 9 %
Neutro Abs: 3.4 10*3/uL (ref 1.7–7.7)
Neutrophils Relative %: 62 %
Platelets: 347 10*3/uL (ref 150–400)
RBC: 3.03 MIL/uL — ABNORMAL LOW (ref 3.87–5.11)
RDW: 13 % (ref 11.5–15.5)
WBC: 5.5 10*3/uL (ref 4.0–10.5)
nRBC: 0 % (ref 0.0–0.2)

## 2018-09-21 LAB — FERRITIN: Ferritin: 79 ng/mL (ref 11–307)

## 2018-09-21 LAB — LACTATE DEHYDROGENASE: LDH: 175 U/L (ref 98–192)

## 2018-09-21 LAB — RETICULOCYTES
Immature Retic Fract: 5.2 % (ref 2.3–15.9)
RBC.: 2.95 MIL/uL — ABNORMAL LOW (ref 3.87–5.11)
Retic Count, Absolute: 26 10*3/uL (ref 19.0–186.0)
Retic Ct Pct: 0.9 % (ref 0.4–3.1)

## 2018-09-21 LAB — IRON AND TIBC
Iron: 92 ug/dL (ref 41–142)
Saturation Ratios: 33 % (ref 21–57)
TIBC: 276 ug/dL (ref 236–444)
UIBC: 185 ug/dL (ref 120–384)

## 2018-09-21 LAB — VITAMIN B12: Vitamin B-12: 344 pg/mL (ref 180–914)

## 2018-09-21 LAB — TSH: TSH: 0.506 u[IU]/mL (ref 0.308–3.960)

## 2018-09-21 LAB — SEDIMENTATION RATE: Sed Rate: 64 mm/hr — ABNORMAL HIGH (ref 0–22)

## 2018-09-21 NOTE — Telephone Encounter (Signed)
Gave and calendar

## 2018-09-22 DIAGNOSIS — E113313 Type 2 diabetes mellitus with moderate nonproliferative diabetic retinopathy with macular edema, bilateral: Secondary | ICD-10-CM | POA: Diagnosis not present

## 2018-09-22 LAB — KAPPA/LAMBDA LIGHT CHAINS
Kappa free light chain: 53.2 mg/L — ABNORMAL HIGH (ref 3.3–19.4)
Kappa, lambda light chain ratio: 1.23 (ref 0.26–1.65)
Lambda free light chains: 43.4 mg/L — ABNORMAL HIGH (ref 5.7–26.3)

## 2018-09-22 LAB — MULTIPLE MYELOMA PANEL, SERUM
Albumin SerPl Elph-Mcnc: 3.7 g/dL (ref 2.9–4.4)
Albumin/Glob SerPl: 1.3 (ref 0.7–1.7)
Alpha 1: 0.2 g/dL (ref 0.0–0.4)
Alpha2 Glob SerPl Elph-Mcnc: 1 g/dL (ref 0.4–1.0)
B-Globulin SerPl Elph-Mcnc: 0.8 g/dL (ref 0.7–1.3)
Gamma Glob SerPl Elph-Mcnc: 0.9 g/dL (ref 0.4–1.8)
Globulin, Total: 2.9 g/dL (ref 2.2–3.9)
IgA: 338 mg/dL (ref 87–352)
IgG (Immunoglobin G), Serum: 964 mg/dL (ref 586–1602)
IgM (Immunoglobulin M), Srm: 194 mg/dL (ref 26–217)
Total Protein ELP: 6.6 g/dL (ref 6.0–8.5)

## 2018-09-22 LAB — FOLATE RBC
Folate, Hemolysate: 265 ng/mL
Folate, RBC: 940 ng/mL (ref >498)
Hematocrit: 28.2 % — ABNORMAL LOW (ref 34.0–46.6)

## 2018-09-22 LAB — HAPTOGLOBIN: Haptoglobin: 202 mg/dL (ref 37–355)

## 2018-09-22 LAB — ERYTHROPOIETIN: Erythropoietin: 6 m[IU]/mL (ref 2.6–18.5)

## 2018-09-23 DIAGNOSIS — E1165 Type 2 diabetes mellitus with hyperglycemia: Secondary | ICD-10-CM | POA: Diagnosis not present

## 2018-09-23 DIAGNOSIS — D649 Anemia, unspecified: Secondary | ICD-10-CM | POA: Diagnosis not present

## 2018-09-23 DIAGNOSIS — E1121 Type 2 diabetes mellitus with diabetic nephropathy: Secondary | ICD-10-CM | POA: Diagnosis not present

## 2018-09-23 DIAGNOSIS — E11311 Type 2 diabetes mellitus with unspecified diabetic retinopathy with macular edema: Secondary | ICD-10-CM | POA: Diagnosis not present

## 2018-09-23 DIAGNOSIS — E785 Hyperlipidemia, unspecified: Secondary | ICD-10-CM | POA: Diagnosis not present

## 2018-09-23 DIAGNOSIS — E1149 Type 2 diabetes mellitus with other diabetic neurological complication: Secondary | ICD-10-CM | POA: Diagnosis not present

## 2018-09-23 DIAGNOSIS — I10 Essential (primary) hypertension: Secondary | ICD-10-CM | POA: Diagnosis not present

## 2018-09-23 DIAGNOSIS — N182 Chronic kidney disease, stage 2 (mild): Secondary | ICD-10-CM | POA: Diagnosis not present

## 2018-10-03 NOTE — Progress Notes (Signed)
HEMATOLOGY/ONCOLOGY CONSULTATION NOTE  Date of Service: 10/05/2018  Patient Care Team: Rankins, Bill Salinas, MD as PCP - General (Family Medicine) Leonie Man, MD as PCP - Cardiology (Cardiology)  CHIEF COMPLAINTS/PURPOSE OF CONSULTATION:  Anemia  HISTORY OF PRESENTING ILLNESS:   Christine Strong is a wonderful 68 y.o. female who has been referred to [place] by Dr Alessandra Bevels for evaluation and management of anemia. The pt reports that she is doing well overall.  The pt reports that she went to the Nephrologist near the beginning of the year and that's when he brought up his concern about her anemia. She reports that he thought that her anemia was due to her Eylea shots, which are only intraoccular injections. She has been receiving these since 2017. Dr Alessandra Bevels, her GI, thought that her anemia could have been due to her Helicobacter pylori infection. She completed her antibiotics and denies having a second stool test to confirm the absence of Helicobacter pylori. Her abdominal pain from the infection has resolved.   Pt reports that her Nephrologist thinks that her proteinuria is associated with her diabetes. Pt has been regularly taking her Metformin for her diabetes, which was diagnosed in 2016. She does not have to take insulin and it is currently well controlled. She is already on a Vitamin B12 replacement which she takes intermittently. Pt is unsure if she was actually Vitamin B12 deficient but notes that supplementation did improve her energy. Pt is currently taking a PO iron supplement. She has been on an acid suppressant since the discovery of her Helicobacter pylori infection.  She has noticed some increased fatigue compared to 6 months ago but also notes that she has significanly lowered her intake of caffeine in the last 6 months. She has been walking more lately which has been helping with her energy levels. Pt reports that she has been feeling more depressed due to isolation  caused by imposed social distancing. She is up to date on her age appropriate cancer screenings.   Pt reports that at her heaviest she used to drink 2-3 glasses of wine per day but that she is now down to 4-5 glasses per week.  Of note prior to the patient's visit today, pt has had EGD completed on 03/25/2018 with results revealing "1. Sm Intestine-Duodenum, Bx: SMALL BOWEL MUCOSA WITH NO SIGNIFICANT PATHOLOGIC CHANGES. Negative for features of Celiac disease. Giardia lamblia organisms not present. 2. Stomach - Antrum, Bx: ACTIVE CHRONIC HELICOBACTER PYLORI ASSOCIATED GASTRITIS. POSITIVE for Helicobacter pylori organisms on IP stain. 3. LG Intestine, Random Colon Bx: COLONIC MUCOSA WITH NO SIGNIFICANT PATHOLOGIC CHANGES. Negative for lymphocytic/collagenous colitis, active colitis or dysplasia. 4. LG Intestine- Descending Colon, Rectum, Polyp: TUBULAR ADENOMAS (2)."  Pt has had Colonoscopy completed on 03/25/2018 with results revealing " -Hemorrhoids found on perianal exam. - The examined portion of the ileum was normal. - Normal mucose in the entire examined colon, Biopsied. - One 4 mm polyp in the descending colon, removed with cold snare. Resected and retrieved. - One 8 mm polyp in the rectum, removed with a cold snare. Resected and removed. - Significant colonic spasm."  Most recent lab results (08/18/2018) of CBC w/diff is as follows: WBC at 5.8K, RBC at 2.96, Hgb at 9.0, HCT at 27.1, MCV at 91.5, MCH at 30.5, MCHC at 33.4, RDW at 13.6, Platelets at 476K, MPV at 7.0, Neutro Rel at 65.9, Lymphs Rel at 24.1, Mono Rel at 7.9, Eos Rel at 1.1, Baso Rel at 1.0, Neutro  Abs at 3.8K, Lymphs Abs at 1.40, Mono Abs at 0.5  On review of systems, pt reports fatigue, depression and denies fevers, chills, unexpected night sweats, unexpected weight loss, mouth sores, new bone pains, abdominal pain, back pain, skin rashes and any other symptoms.   On PMHx the pt reports Diabetes, HTN, HLD, central retinal artery  occlusion (right). On Social Hx the pt reports non smoker, 4-5 glasses of wine per week. On Family Hx the pt reports father who passed from lung cancer.  INTERVAL HISTORY:   I connected with  ARMEDA PLUMB on 10/05/18 by telephone and verified that I am speaking with the correct person using two identifiers.   I discussed the limitations of evaluation and management by telemedicine. The patient expressed understanding and agreed to proceed.  Other persons participating in the visit and their role in the encounter:    -Yevette Edwards, Medical Scribe  Patient's location: Home Provider's location: [facility] at [place] is a wonderful 68 y.o. female who is here for evaluation and management of anemia. The patient's last visit with [place] was on 09/21/2018. The pt reports that she is doing well overall.  The pt reports that she has not noticed any blood in her stools. She has not been taking 1000 ?g Vitamin B12 regularly. Pt has been taking her Protonix every other day and continued taking her Metformin as prescribed.   Lab results (09/21/18) of CBC w/diff and CMP is as follows: all values are WNL except for RBC at 3.03, Hgb at 9.3, HCT at 28.2, Glucose at 100, BUN at 53, Creatinine at 1.68, AST at 13, GFR Est Non Af Am at 31. 09/21/2018 Vitamin B12 at 344 09/21/2018 Ferritin at 79 09/21/2018 TSH at 0.506 09/21/2018 Haptoglobin at 202 09/21/2018 Sed Rate at 64 09/21/2018 Erythropoietin at 6.0 09/21/2018 LDH at 175 09/21/2018 Reticulocytes is as follows: Retic Ct Pct at 0.9, RBC at 2.95, Abs Retic Count at 26.0, Immature Retic Fract at 5.2 09/21/2018 Folate RBC is as follows: Folate Hemolysate at 265.0, HCT at 28.2, Folate RBC at 940  09/21/2018 Iron and TIBC is as follows: Iron at 92, TIBC at 276, Sat Ratios at 33, UIBC at 185 09/21/2018 K/L light chains is as follows: Kappa free light chain at 53.2, Lamda free light chains at 43.4, K/L light chain ratio  at 1.23 09/21/2018 MMP is as follows: IgG at 964, IgA at 338, IgM at 194, Total Protein ELP at 6.6, Albumin at 3.7, Alpha 1 at 0.2, Alpha2 at 1.0, B-Globulin at 0.8, Gamma Glob at 0.9, M Protein is "Not Observed", Total Globulin at 2.9, Albumin/Glob at 1.3, IFE 1 shows "The immunofixation pattern appears unremarkable. Evidence of monoclonal protein is not apparent."  On review of systems, pt denies blood in the stools and any other symptoms.   MEDICAL HISTORY:  Past Medical History:  Diagnosis Date   Diabetes mellitus with neuropathy ([place])    On metformin   Hyperlipidemia    On atorvastatin 20 mg   Hypertension    On nifedipine 60 mg along with Lopressor 12.5 twice daily   Mild aortic stenosis by prior echocardiogram 02/2018   Moderate aortic valve thickening-mild stenosis (mean gradient 9.3 mmHg)    SURGICAL HISTORY: Past Surgical History:  Procedure Laterality Date   CATARACT EXTRACTION Left 12/19/2017   TRANSTHORACIC ECHOCARDIOGRAM  03/06/2018   Mildly increased LV thickness.  EF 60-65%.  Now RWMA (read as restrictive filling pattern--on my review  this is clearly incorrect, is more grade 1 diastolic dysfunction).  Mild LA dilation.  Moderate mitral valve thickening and calcification but no stenosis or regurgitation.  Moderate aortic valve thickening-mild aortic stenosis.    SOCIAL HISTORY: Social History   Socioeconomic History   Marital status: Divorced    Spouse name: Not on file   Number of children: 0   Years of education: Not on file   Highest education level: Not on file  Occupational History   Occupation: Passenger transport manager: Insurance account manager strain: Not on file   Food insecurity    Worry: Not on file    Inability: Not on file   Transportation needs    Medical: Not on file    Non-medical: Not on file  Tobacco Use   Smoking status: Never Smoker   Smokeless tobacco: Never Used  Substance and Sexual  Activity   Alcohol use: Yes    Alcohol/week: 7.0 standard drinks    Types: 7 Glasses of wine per week    Comment: Maybe 1 glass of wine a week   Drug use: Never   Sexual activity: Not on file  Lifestyle   Physical activity    Days per week: Not on file    Minutes per session: Not on file   Stress: Not on file  Relationships   Social connections    Talks on phone: Not on file    Gets together: Not on file    Attends religious service: Not on file    Active member of club or organization: Not on file    Attends meetings of clubs or organizations: Not on file    Relationship status: Not on file   Intimate partner violence    Fear of current or ex partner: Not on file    Emotionally abused: Not on file    Physically abused: Not on file    Forced sexual activity: Not on file  Other Topics Concern   Not on file  Social History Narrative   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   Never smoked.  Enjoys a glass of wine a day.      Does not necessarily get routine exercise because she is tired and work.    FAMILY HISTORY: Family History  Problem Relation Age of Onset   Diabetes Mellitus II Mother    Hypertension Mother    Diabetes Mellitus II Father    Diabetes Mellitus II Brother     ALLERGIES:  has No Known Allergies.  MEDICATIONS:  Current Outpatient Medications  Medication Sig Dispense Refill   atorvastatin (LIPITOR) 20 MG tablet Take 20 mg by mouth daily.     Cholecalciferol (VITAMIN D-1000 MAX ST) 25 MCG (1000 UT) tablet Take by mouth.     Cyanocobalamin (VITAMIN B 12 PO) Take 2 tablets by mouth daily.     hydrochlorothiazide (HYDRODIURIL) 12.5 MG tablet TAKE TWO TABLETS BY MOUTH DAILY     iron polysaccharides (NIFEREX) 150 MG capsule Take 1 capsule (150 mg total) by mouth daily. 60 capsule 3   lisinopril (PRINIVIL,ZESTRIL) 10 MG tablet Take 1 tablet (10 mg total) by mouth daily. 30 tablet 0   Melatonin 3 MG TABS 3 mg nightly.      metformin (FORTAMET) 1000 MG (OSM) 24 hr tablet Take 1 tablet (1,000 mg total) by mouth 2 (two) times daily with a meal. 60 tablet 0   metoprolol tartrate (LOPRESSOR) 25 MG  tablet TAKE ONE TABLET BY MOUTH TWICE A DAY (REPLACES LABETALOL)     NIFEdipine (PROCARDIA XL/NIFEDICAL XL) 60 MG 24 hr tablet Take by mouth.     pantoprazole (PROTONIX) 40 MG tablet 40 mg daily.      No current facility-administered medications for this visit.     REVIEW OF SYSTEMS:    A 10+ POINT REVIEW OF SYSTEMS WAS OBTAINED including neurology, dermatology, psychiatry, cardiac, respiratory, lymph, extremities, GI, GU, Musculoskeletal, constitutional, breasts, reproductive, HEENT.  All pertinent positives are noted in the HPI.  All others are negative.   PHYSICAL EXAMINATION: ECOG PERFORMANCE STATUS: 1 - Symptomatic but completely ambulatory  . There were no vitals filed for this visit. There were no vitals filed for this visit. .There is no height or weight on file to calculate BMI.  Phone visit  LABORATORY DATA:  I have reviewed the data as listed  . CBC Latest Ref Rng & Units 09/21/2018 09/21/2018  WBC 4.0 - 10.5 K/uL 5.5 -  Hemoglobin 12.0 - 15.0 g/dL 9.3(L) -  Hematocrit 34.0 - 46.6 % 28.2(L) 28.2(L)  Platelets 150 - 400 K/uL 347 -    . CBC Latest Ref Rng & Units 09/21/2018 09/21/2018  WBC 4.0 - 10.5 K/uL 5.5 -  Hemoglobin 12.0 - 15.0 g/dL 9.3(L) -  Hematocrit 34.0 - 46.6 % 28.2(L) 28.2(L)  Platelets 150 - 400 K/uL 347 -   .CBC    Component Value Date/Time   WBC 5.5 09/21/2018 1213   RBC 2.95 (L) 09/21/2018 1213   RBC 3.03 (L) 09/21/2018 1213   HGB 9.3 (L) 09/21/2018 1213   HCT 28.2 (L) 09/21/2018 1213   HCT 28.2 (L) 09/21/2018 1213   PLT 347 09/21/2018 1213   MCV 93.1 09/21/2018 1213   MCH 30.7 09/21/2018 1213   MCHC 33.0 09/21/2018 1213   RDW 13.0 09/21/2018 1213   LYMPHSABS 1.5 09/21/2018 1213   MONOABS 0.5 09/21/2018 1213   EOSABS 0.1 09/21/2018 1213   BASOSABS 0.0 09/21/2018  1213    . CMP Latest Ref Rng & Units 09/21/2018 12/05/2013  Glucose 70 - 99 mg/dL 100(H) 117(H)  BUN 8 - 23 mg/dL 53(H) 21  Creatinine 0.44 - 1.00 mg/dL 1.68(H) 0.57  Sodium 135 - 145 mmol/L 139 140  Potassium 3.5 - 5.1 mmol/L 4.9 4.9  Chloride 98 - 111 mmol/L 107 102  CO2 22 - 32 mmol/L 22 24  Calcium 8.9 - 10.3 mg/dL 9.5 9.7  Total Protein 6.5 - 8.1 g/dL 7.6 -  Total Bilirubin 0.3 - 1.2 mg/dL 0.4 -  Alkaline Phos 38 - 126 U/L 49 -  AST 15 - 41 U/L 13(L) -  ALT 0 - 44 U/L 9 -   . Lab Results  Component Value Date   IRON 92 09/21/2018   TIBC 276 09/21/2018   IRONPCTSAT 33 09/21/2018   (Iron and TIBC)  Lab Results  Component Value Date   FERRITIN 79 09/21/2018    03/25/2018 Colonoscopy    03/25/2018 EDG    RADIOGRAPHIC STUDIES: I have personally reviewed the radiological images as listed and agreed with the findings in the report. No results found.  ASSESSMENT & PLAN:   1) Normocytic Anemia   03/25/2018 EGD which revealed "1. Sm Intestine-Duodenum, Bx: SMALL BOWEL MUCOSA WITH NO SIGNIFICANT PATHOLOGIC CHANGES. Negative for features of Celiac disease. Giardia lamblia organisms not present. 2. Stomach - Antrum, Bx: ACTIVE CHRONIC HELICOBACTER PYLORI ASSOCIATED GASTRITIS. POSITIVE for Helicobacter pylori organisms on IP stain. 3. LG Intestine,  Random Colon Bx: COLONIC MUCOSA WITH NO SIGNIFICANT PATHOLOGIC CHANGES. Negative for lymphocytic/collagenous colitis, active colitis or dysplasia. 4. LG Intestine- Descending Colon, Rectum, Polyp: TUBULAR ADENOMAS (2)."  03/25/2018 Colonoscopy which revealed " -Hemorrhoids found on perianal exam. - The examined portion of the ileum was normal. - Normal mucose in the entire examined colon, Biopsied. - One 4 mm polyp in the descending colon, removed with cold snare. Resected and retrieved. - One 8 mm polyp in the rectum, removed with a cold snare. Resected and removed. - Significant colonic spasm."  PLAN: -Discussed pt labwork,  09/21/18; all values are WNL except for RBC at 3.03, Hgb at 9.3, HCT at 28.2, Glucose at 100, BUN at 53, Creatinine at 1.68, AST at 13, GFR Est Non Af Am at 31. -Discussed 09/21/2018 Vitamin B12 at 344 -Discussed 09/21/2018 Ferritin at 79 -Discussed 09/21/2018 TSH at 0.506 -Discussed 09/21/2018 Haptoglobin at 202 -Discussed 09/21/2018 Sed Rate at 64 -Discussed 09/21/2018 Erythropoietin at 6.0 -Discussed 09/21/2018 LDH at 175 -Discussed 09/21/2018 Reticulocytes is as follows: Retic Ct Pct at 0.9, RBC at 2.95, Abs Retic Count at 26.0, Immature Retic Fract at 5.2 -Discussed 09/21/2018 Folate RBC is as follows: Folate Hemolysate at 265.0, HCT at 28.2, Folate RBC at 940  -Discussed 09/21/2018 Iron and TIBC is as follows: Iron at 92, TIBC at 276, Sat Ratios at 33, UIBC at 185 -Discussed 09/21/2018 K/L light chains is as follows: Kappa free light chain at 53.2, Lamda free light chains at 43.4, K/L light chain ratio at 1.23 -Discussed 09/21/2018 MMP is as follows: IgG at 964, IgA at 338, IgM at 194, Total Protein ELP at 6.6, Albumin at 3.7, Alpha 1 at 0.2, Alpha2 at 1.0, B-Globulin at 0.8, Gamma Glob at 0.9, M Protein is "Not Observed", Total Globulin at 2.9, Albumin/Glob at 1.3, IFE 1 shows "The immunofixation pattern appears unremarkable. Evidence of monoclonal protein is not apparent." -Anemia in the setting on H. pylori infection -No evidence of hemolysis based on labs  -Goal Hgb >9-10 -Pt has Stage 3 CKD; could reduce amount of erythropoietin, could be a source of inflammation   -Due to CKD, Goal Ferritin >200  -Recommend PO iron polysaccharide 150 mg BID -Recommend 1000 ?g of Vitamin B12 SL -Recommend B-complex Vitamin  -If blood counts continue to decrease will consider BM Bx or erythropoietin injection  -Will see back in 3 months with labs   FOLLOW UP: Rtc with Dr Irene Limbo with labs in 3 months  . Orders Placed This Encounter  Procedures   CBC with Differential/Platelet    Standing  Status:   Future    Standing Expiration Date:   11/09/2019   Ferritin    Standing Status:   Future    Standing Expiration Date:   10/05/2019   Iron and TIBC    Standing Status:   Future    Standing Expiration Date:   10/05/2019   Vitamin B12    Standing Status:   Future    Standing Expiration Date:   10/05/2019    The total time spent in the appt was 20 minutes and more than 50% was on counseling and direct patient cares.  All of the patient's questions were answered with apparent satisfaction. The patient knows to call the clinic with any problems, questions or concerns.    Sullivan Lone MD [place] AAHIVMS [facility] Hematology/Oncology Physician [facility]  (Office):       [phone] (Work cell):  [phone] (Fax):  [phone]  10/05/2018 2:26 PM  I, Yevette Edwards, am acting as a scribe for Dr. Sullivan Lone.   .I have reviewed the above documentation for accuracy and completeness, and I agree with the above. Brunetta Genera MD

## 2018-10-05 ENCOUNTER — Telehealth: Payer: Self-pay | Admitting: Hematology

## 2018-10-05 ENCOUNTER — Inpatient Hospital Stay (HOSPITAL_BASED_OUTPATIENT_CLINIC_OR_DEPARTMENT_OTHER): Payer: Medicare HMO | Admitting: Hematology

## 2018-10-05 DIAGNOSIS — D649 Anemia, unspecified: Secondary | ICD-10-CM

## 2018-10-05 DIAGNOSIS — D638 Anemia in other chronic diseases classified elsewhere: Secondary | ICD-10-CM

## 2018-10-05 DIAGNOSIS — R69 Illness, unspecified: Secondary | ICD-10-CM | POA: Diagnosis not present

## 2018-10-05 MED ORDER — POLYSACCHARIDE IRON COMPLEX 150 MG PO CAPS: 150.0000 mg | ORAL_CAPSULE | Freq: Every day | ORAL | 3 refills | Status: DC

## 2018-10-05 NOTE — Telephone Encounter (Signed)
Scheduled appt per 9/28 los.  Spoke with pt and she is aware of the appt date and time.

## 2018-11-10 DIAGNOSIS — D649 Anemia, unspecified: Secondary | ICD-10-CM | POA: Diagnosis not present

## 2018-11-10 DIAGNOSIS — I10 Essential (primary) hypertension: Secondary | ICD-10-CM | POA: Diagnosis not present

## 2018-11-10 DIAGNOSIS — E1165 Type 2 diabetes mellitus with hyperglycemia: Secondary | ICD-10-CM | POA: Diagnosis not present

## 2018-11-10 DIAGNOSIS — E7800 Pure hypercholesterolemia, unspecified: Secondary | ICD-10-CM | POA: Diagnosis not present

## 2018-11-10 DIAGNOSIS — Z7984 Long term (current) use of oral hypoglycemic drugs: Secondary | ICD-10-CM | POA: Diagnosis not present

## 2018-11-10 DIAGNOSIS — N182 Chronic kidney disease, stage 2 (mild): Secondary | ICD-10-CM | POA: Diagnosis not present

## 2018-11-10 DIAGNOSIS — E785 Hyperlipidemia, unspecified: Secondary | ICD-10-CM | POA: Diagnosis not present

## 2018-11-10 DIAGNOSIS — E1149 Type 2 diabetes mellitus with other diabetic neurological complication: Secondary | ICD-10-CM | POA: Diagnosis not present

## 2018-11-10 DIAGNOSIS — E11311 Type 2 diabetes mellitus with unspecified diabetic retinopathy with macular edema: Secondary | ICD-10-CM | POA: Diagnosis not present

## 2018-11-10 DIAGNOSIS — E1121 Type 2 diabetes mellitus with diabetic nephropathy: Secondary | ICD-10-CM | POA: Diagnosis not present

## 2018-11-17 DIAGNOSIS — E1165 Type 2 diabetes mellitus with hyperglycemia: Secondary | ICD-10-CM | POA: Diagnosis not present

## 2018-11-17 DIAGNOSIS — E113313 Type 2 diabetes mellitus with moderate nonproliferative diabetic retinopathy with macular edema, bilateral: Secondary | ICD-10-CM | POA: Diagnosis not present

## 2018-11-29 DIAGNOSIS — R69 Illness, unspecified: Secondary | ICD-10-CM | POA: Diagnosis not present

## 2018-12-09 DIAGNOSIS — I129 Hypertensive chronic kidney disease with stage 1 through stage 4 chronic kidney disease, or unspecified chronic kidney disease: Secondary | ICD-10-CM | POA: Diagnosis not present

## 2018-12-09 DIAGNOSIS — D649 Anemia, unspecified: Secondary | ICD-10-CM | POA: Diagnosis not present

## 2018-12-09 DIAGNOSIS — M9080 Osteopathy in diseases classified elsewhere, unspecified site: Secondary | ICD-10-CM | POA: Diagnosis not present

## 2018-12-09 DIAGNOSIS — E889 Metabolic disorder, unspecified: Secondary | ICD-10-CM | POA: Diagnosis not present

## 2018-12-09 DIAGNOSIS — N182 Chronic kidney disease, stage 2 (mild): Secondary | ICD-10-CM | POA: Diagnosis not present

## 2018-12-09 DIAGNOSIS — E559 Vitamin D deficiency, unspecified: Secondary | ICD-10-CM | POA: Diagnosis not present

## 2018-12-14 DIAGNOSIS — E559 Vitamin D deficiency, unspecified: Secondary | ICD-10-CM | POA: Diagnosis not present

## 2018-12-14 DIAGNOSIS — D631 Anemia in chronic kidney disease: Secondary | ICD-10-CM | POA: Diagnosis not present

## 2018-12-14 DIAGNOSIS — E1122 Type 2 diabetes mellitus with diabetic chronic kidney disease: Secondary | ICD-10-CM | POA: Diagnosis not present

## 2018-12-14 DIAGNOSIS — N1832 Chronic kidney disease, stage 3b: Secondary | ICD-10-CM | POA: Diagnosis not present

## 2018-12-14 DIAGNOSIS — M9080 Osteopathy in diseases classified elsewhere, unspecified site: Secondary | ICD-10-CM | POA: Diagnosis not present

## 2018-12-14 DIAGNOSIS — E889 Metabolic disorder, unspecified: Secondary | ICD-10-CM | POA: Diagnosis not present

## 2018-12-14 DIAGNOSIS — I129 Hypertensive chronic kidney disease with stage 1 through stage 4 chronic kidney disease, or unspecified chronic kidney disease: Secondary | ICD-10-CM | POA: Diagnosis not present

## 2018-12-14 DIAGNOSIS — R801 Persistent proteinuria, unspecified: Secondary | ICD-10-CM | POA: Diagnosis not present

## 2018-12-21 ENCOUNTER — Telehealth: Payer: Self-pay | Admitting: Hematology

## 2018-12-21 NOTE — Telephone Encounter (Signed)
[place] PAL 12/28 moved lab/fu to 1/26. Left message. Schedule mailed.

## 2018-12-29 DIAGNOSIS — E1165 Type 2 diabetes mellitus with hyperglycemia: Secondary | ICD-10-CM | POA: Diagnosis not present

## 2018-12-29 DIAGNOSIS — E113313 Type 2 diabetes mellitus with moderate nonproliferative diabetic retinopathy with macular edema, bilateral: Secondary | ICD-10-CM | POA: Diagnosis not present

## 2019-01-04 ENCOUNTER — Ambulatory Visit: Payer: Medicare HMO | Admitting: Hematology

## 2019-01-04 ENCOUNTER — Other Ambulatory Visit: Payer: Medicare HMO

## 2019-01-13 DIAGNOSIS — I10 Essential (primary) hypertension: Secondary | ICD-10-CM | POA: Diagnosis not present

## 2019-01-13 DIAGNOSIS — D649 Anemia, unspecified: Secondary | ICD-10-CM | POA: Diagnosis not present

## 2019-01-13 DIAGNOSIS — E7800 Pure hypercholesterolemia, unspecified: Secondary | ICD-10-CM | POA: Diagnosis not present

## 2019-01-13 DIAGNOSIS — E1121 Type 2 diabetes mellitus with diabetic nephropathy: Secondary | ICD-10-CM | POA: Diagnosis not present

## 2019-01-19 DIAGNOSIS — D649 Anemia, unspecified: Secondary | ICD-10-CM | POA: Diagnosis not present

## 2019-01-19 DIAGNOSIS — Z7984 Long term (current) use of oral hypoglycemic drugs: Secondary | ICD-10-CM | POA: Diagnosis not present

## 2019-01-19 DIAGNOSIS — E1121 Type 2 diabetes mellitus with diabetic nephropathy: Secondary | ICD-10-CM | POA: Diagnosis not present

## 2019-01-19 DIAGNOSIS — I10 Essential (primary) hypertension: Secondary | ICD-10-CM | POA: Diagnosis not present

## 2019-01-19 DIAGNOSIS — E7800 Pure hypercholesterolemia, unspecified: Secondary | ICD-10-CM | POA: Diagnosis not present

## 2019-02-01 NOTE — Progress Notes (Signed)
HEMATOLOGY/ONCOLOGY CLINIC NOTE  Date of Service: 02/02/2019  Patient Care Team: Rankins, Bill Salinas, MD as PCP - General (Family Medicine) Leonie Man, MD as PCP - Cardiology (Cardiology)  CHIEF COMPLAINTS/PURPOSE OF CONSULTATION:  Anemia  HISTORY OF PRESENTING ILLNESS:   Christine Strong is a wonderful 69 y.o. female who has been referred to [place] by Dr Alessandra Bevels for evaluation and management of anemia. The pt reports that she is doing well overall.  The pt reports that she went to the Nephrologist near the beginning of the year and that's when he brought up his concern about her anemia. She reports that he thought that her anemia was due to her Eylea shots, which are only intraoccular injections. She has been receiving these since 2017. Dr Alessandra Bevels, her GI, thought that her anemia could have been due to her Helicobacter pylori infection. She completed her antibiotics and denies having a second stool test to confirm the absence of Helicobacter pylori. Her abdominal pain from the infection has resolved.   Pt reports that her Nephrologist thinks that her proteinuria is associated with her diabetes. Pt has been regularly taking her Metformin for her diabetes, which was diagnosed in 2016. She does not have to take insulin and it is currently well controlled. She is already on a Vitamin B12 replacement which she takes intermittently. Pt is unsure if she was actually Vitamin B12 deficient but notes that supplementation did improve her energy. Pt is currently taking a PO iron supplement. She has been on an acid suppressant since the discovery of her Helicobacter pylori infection.  She has noticed some increased fatigue compared to 6 months ago but also notes that she has significanly lowered her intake of caffeine in the last 6 months. She has been walking more lately which has been helping with her energy levels. Pt reports that she has been feeling more depressed due to isolation caused by  imposed social distancing. She is up to date on her age appropriate cancer screenings.   Pt reports that at her heaviest she used to drink 2-3 glasses of wine per day but that she is now down to 4-5 glasses per week.  Of note prior to the patient's visit today, pt has had EGD completed on 03/25/2018 with results revealing "1. Sm Intestine-Duodenum, Bx: SMALL BOWEL MUCOSA WITH NO SIGNIFICANT PATHOLOGIC CHANGES. Negative for features of Celiac disease. Giardia lamblia organisms not present. 2. Stomach - Antrum, Bx: ACTIVE CHRONIC HELICOBACTER PYLORI ASSOCIATED GASTRITIS. POSITIVE for Helicobacter pylori organisms on IP stain. 3. LG Intestine, Random Colon Bx: COLONIC MUCOSA WITH NO SIGNIFICANT PATHOLOGIC CHANGES. Negative for lymphocytic/collagenous colitis, active colitis or dysplasia. 4. LG Intestine- Descending Colon, Rectum, Polyp: TUBULAR ADENOMAS (2)."  Pt has had Colonoscopy completed on 03/25/2018 with results revealing " -Hemorrhoids found on perianal exam. - The examined portion of the ileum was normal. - Normal mucose in the entire examined colon, Biopsied. - One 4 mm polyp in the descending colon, removed with cold snare. Resected and retrieved. - One 8 mm polyp in the rectum, removed with a cold snare. Resected and removed. - Significant colonic spasm."  Most recent lab results (08/18/2018) of CBC w/diff is as follows: WBC at 5.8K, RBC at 2.96, Hgb at 9.0, HCT at 27.1, MCV at 91.5, MCH at 30.5, MCHC at 33.4, RDW at 13.6, Platelets at 476K, MPV at 7.0, Neutro Rel at 65.9, Lymphs Rel at 24.1, Mono Rel at 7.9, Eos Rel at 1.1, Baso Rel at 1.0, Neutro  Abs at 3.8K, Lymphs Abs at 1.40, Mono Abs at 0.5  On review of systems, pt reports fatigue, depression and denies fevers, chills, unexpected night sweats, unexpected weight loss, mouth sores, new bone pains, abdominal pain, back pain, skin rashes and any other symptoms.   On PMHx the pt reports Diabetes, HTN, HLD, central retinal artery occlusion  (right). On Social Hx the pt reports non smoker, 4-5 glasses of wine per week. On Family Hx the pt reports father who passed from lung cancer.  INTERVAL HISTORY:   Christine Strong is a wonderful 69 y.o. female who is here for evaluation and management of anemia. The patient's last visit with [place] was on 10/05/2018. The pt reports that she is doing well overall.  The pt reports that she has continued to feel fatigued. Pt has been taking PO Iron and PO Vitamin B12 as recommended. Pt had to take a break from PO Iron over the last week due to it causing diarrhea. She has been taking Omeprazole about once per week.   Pt had an appointment for the COVID19 vaccine but it was canceled do to lack of vaccine availability.  Lab results today (02/02/19) of CBC w/diff is as follows: all values are WNL except for RBC at 2.76, Hgb at 8.5, HCT at 25.0. 02/02/19 Vitamin B12 at 569 02/02/19 Ferritin is 77 02/02/19 Iron saturation of 26%  On review of systems, pt reports fatigue and denies dizziness, lightheadedness, bloody/black stools, fevers, chills, night sweats, unexpected weight loss, leg swelling and any other symptoms.   MEDICAL HISTORY:  Past Medical History:  Diagnosis Date  . Diabetes mellitus with neuropathy ([place])    On metformin  . Hyperlipidemia    On atorvastatin 20 mg  . Hypertension    On nifedipine 60 mg along with Lopressor 12.5 twice daily  . Mild aortic stenosis by prior echocardiogram 02/2018   Moderate aortic valve thickening-mild stenosis (mean gradient 9.3 mmHg)    SURGICAL HISTORY: Past Surgical History:  Procedure Laterality Date  . CATARACT EXTRACTION Left 12/19/2017  . TRANSTHORACIC ECHOCARDIOGRAM  03/06/2018   Mildly increased LV thickness.  EF 60-65%.  Now RWMA (read as restrictive filling pattern--on my review this is clearly incorrect, is more grade 1 diastolic dysfunction).  Mild LA dilation.  Moderate mitral valve thickening and calcification but no stenosis or  regurgitation.  Moderate aortic valve thickening-mild aortic stenosis.    SOCIAL HISTORY: Social History   Socioeconomic History  . Marital status: Divorced    Spouse name: Not on file  . Number of children: 0  . Years of education: Not on file  . Highest education level: Not on file  Occupational History  . Occupation: Hostess/waitress    Employer: [employer]  Tobacco Use  . Smoking status: Never Smoker  . Smokeless tobacco: Never Used  Substance and Sexual Activity  . Alcohol use: Yes    Alcohol/week: 7.0 standard drinks    Types: 7 Glasses of wine per week    Comment: Maybe 1 glass of wine a week  . Drug use: Never  . Sexual activity: Not on file  Other Topics Concern  . 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   Never smoked.  Enjoys a glass of wine a day.      Does not necessarily get routine exercise because she is tired and work.   Social Determinants of Health   Financial Resource Strain:   .  Difficulty of Paying Living Expenses: Not on file  Food Insecurity:   . Worried About Charity fundraiser in the Last Year: Not on file  . Ran Out of Food in the Last Year: Not on file  Transportation Needs:   . Lack of Transportation (Medical): Not on file  . Lack of Transportation (Non-Medical): Not on file  Physical Activity:   . Days of Exercise per Week: Not on file  . Minutes of Exercise per Session: Not on file  Stress:   . Feeling of Stress : Not on file  Social Connections:   . Frequency of Communication with Friends and Family: Not on file  . Frequency of Social Gatherings with Friends and Family: Not on file  . Attends Religious Services: Not on file  . Active Member of Clubs or Organizations: Not on file  . Attends Archivist Meetings: Not on file  . Marital Status: Not on file  Intimate Partner Violence:   . Fear of Current or Ex-Partner: Not on file  . Emotionally Abused: Not on  file  . Physically Abused: Not on file  . Sexually Abused: Not on file    FAMILY HISTORY: Family History  Problem Relation Age of Onset  . Diabetes Mellitus II Mother   . Hypertension Mother   . Diabetes Mellitus II Father   . Diabetes Mellitus II Brother     ALLERGIES:  has No Known Allergies.  MEDICATIONS:  Current Outpatient Medications  Medication Sig Dispense Refill  . atorvastatin (LIPITOR) 80 MG tablet Take by mouth.    . brimonidine (ALPHAGAN) 0.2 % ophthalmic solution     . Cholecalciferol (VITAMIN D-1000 MAX ST) 25 MCG (1000 UT) tablet Take by mouth.    . Cyanocobalamin (VITAMIN B 12 PO) Take 2 tablets by mouth daily.    . dorzolamide (TRUSOPT) 2 % ophthalmic solution     . iron polysaccharides (NIFEREX) 150 MG capsule Take 1 capsule (150 mg total) by mouth daily. 60 capsule 3  . lisinopril (ZESTRIL) 40 MG tablet     . Melatonin 3 MG TABS 3 mg nightly.    . metformin (FORTAMET) 1000 MG (OSM) 24 hr tablet Take 1 tablet (1,000 mg total) by mouth 2 (two) times daily with a meal. 60 tablet 0  . metoprolol tartrate (LOPRESSOR) 25 MG tablet TAKE ONE TABLET BY MOUTH TWICE A DAY (REPLACES LABETALOL)    . NIFEdipine (PROCARDIA XL/NIFEDICAL XL) 60 MG 24 hr tablet Take by mouth.    . pantoprazole (PROTONIX) 40 MG tablet 40 mg daily.     . timolol (TIMOPTIC) 0.5 % ophthalmic solution     . ergocalciferol (VITAMIN D2) 1.25 MG (50000 UT) capsule Take by mouth.     No current facility-administered medications for this visit.    REVIEW OF SYSTEMS:   A 10+ POINT REVIEW OF SYSTEMS WAS OBTAINED including neurology, dermatology, psychiatry, cardiac, respiratory, lymph, extremities, GI, GU, Musculoskeletal, constitutional, breasts, reproductive, HEENT.  All pertinent positives are noted in the HPI.  All others are negative.    PHYSICAL EXAMINATION: ECOG PERFORMANCE STATUS: 1 - Symptomatic but completely ambulatory  . Vitals:   02/02/19 1543 02/02/19 1547  BP: (!) 174/77 (!) 176/76    Pulse: 68   Resp: 17   Temp: 98 F (36.7 C)   SpO2: 100%    Filed Weights   02/02/19 1543  Weight: 137 lb 3.2 oz (62.2 kg)   .Body mass index is 22.48 kg/m.   GENERAL:alert,  in no acute distress and comfortable SKIN: no acute rashes, no significant lesions EYES: conjunctiva are pink and non-injected, sclera anicteric OROPHARYNX: MMM, no exudates, no oropharyngeal erythema or ulceration NECK: supple, no JVD LYMPH:  no palpable lymphadenopathy in the cervical, axillary or inguinal regions LUNGS: clear to auscultation b/l with normal respiratory effort HEART: regular rate & rhythm ABDOMEN:  normoactive bowel sounds , non tender, not distended. No palpable hepatosplenomegaly.  Extremity: no pedal edema PSYCH: alert & oriented x 3 with fluent speech NEURO: no focal motor/sensory deficits  LABORATORY DATA:  I have reviewed the data as listed  . CBC Latest Ref Rng & Units 02/02/2019 09/21/2018 09/21/2018  WBC 4.0 - 10.5 K/uL 6.0 5.5 -  Hemoglobin 12.0 - 15.0 g/dL 8.5(L) 9.3(L) -  Hematocrit 36.0 - 46.0 % 25.0(L) 28.2(L) 28.2(L)  Platelets 150 - 400 K/uL 317 347 -    . CBC Latest Ref Rng & Units 02/02/2019 09/21/2018 09/21/2018  WBC 4.0 - 10.5 K/uL 6.0 5.5 -  Hemoglobin 12.0 - 15.0 g/dL 8.5(L) 9.3(L) -  Hematocrit 36.0 - 46.0 % 25.0(L) 28.2(L) 28.2(L)  Platelets 150 - 400 K/uL 317 347 -   .CBC    Component Value Date/Time   WBC 6.0 02/02/2019 1432   RBC 2.76 (L) 02/02/2019 1432   HGB 8.5 (L) 02/02/2019 1432   HCT 25.0 (L) 02/02/2019 1432   HCT 28.2 (L) 09/21/2018 1213   PLT 317 02/02/2019 1432   MCV 90.6 02/02/2019 1432   MCH 30.8 02/02/2019 1432   MCHC 34.0 02/02/2019 1432   RDW 13.2 02/02/2019 1432   LYMPHSABS 1.6 02/02/2019 1432   MONOABS 0.5 02/02/2019 1432   EOSABS 0.1 02/02/2019 1432   BASOSABS 0.0 02/02/2019 1432    . CMP Latest Ref Rng & Units 09/21/2018 12/05/2013  Glucose 70 - 99 mg/dL 100(H) 117(H)  BUN 8 - 23 mg/dL 53(H) 21  Creatinine 0.44 - 1.00  mg/dL 1.68(H) 0.57  Sodium 135 - 145 mmol/L 139 140  Potassium 3.5 - 5.1 mmol/L 4.9 4.9  Chloride 98 - 111 mmol/L 107 102  CO2 22 - 32 mmol/L 22 24  Calcium 8.9 - 10.3 mg/dL 9.5 9.7  Total Protein 6.5 - 8.1 g/dL 7.6 -  Total Bilirubin 0.3 - 1.2 mg/dL 0.4 -  Alkaline Phos 38 - 126 U/L 49 -  AST 15 - 41 U/L 13(L) -  ALT 0 - 44 U/L 9 -   . Lab Results  Component Value Date   IRON 92 09/21/2018   TIBC 276 09/21/2018   IRONPCTSAT 33 09/21/2018   (Iron and TIBC)  Lab Results  Component Value Date   FERRITIN 79 09/21/2018    03/25/2018 Colonoscopy    03/25/2018 EDG    RADIOGRAPHIC STUDIES: I have personally reviewed the radiological images as listed and agreed with the findings in the report. No results found.  ASSESSMENT & PLAN:   1) Normocytic Anemia   03/25/2018 EGD which revealed "1. Sm Intestine-Duodenum, Bx: SMALL BOWEL MUCOSA WITH NO SIGNIFICANT PATHOLOGIC CHANGES. Negative for features of Celiac disease. Giardia lamblia organisms not present. 2. Stomach - Antrum, Bx: ACTIVE CHRONIC HELICOBACTER PYLORI ASSOCIATED GASTRITIS. POSITIVE for Helicobacter pylori organisms on IP stain. 3. LG Intestine, Random Colon Bx: COLONIC MUCOSA WITH NO SIGNIFICANT PATHOLOGIC CHANGES. Negative for lymphocytic/collagenous colitis, active colitis or dysplasia. 4. LG Intestine- Descending Colon, Rectum, Polyp: TUBULAR ADENOMAS (2)."  03/25/2018 Colonoscopy which revealed " -Hemorrhoids found on perianal exam. - The examined portion of the ileum was normal. -  Normal mucose in the entire examined colon, Biopsied. - One 4 mm polyp in the descending colon, removed with cold snare. Resected and retrieved. - One 8 mm polyp in the rectum, removed with a cold snare. Resected and removed. - Significant colonic spasm."  PLAN: -Discussed pt labwork today, 02/02/19; anemia is slightly worsened, other blood counts are normal -Discussed01/26/21 Vitamin B12 at 569 -Discussed01/26/21 Ferritin is in  77 -Discussed01/26/21 Iron saturation 26% -Will f/u with as appropriate for Ferritin, Iron and TIBC -Advised pt that low erythropoietin levels suggests that anemia is most likely due to Stage 3 CKD -Advised pt that in the setting of CKD, Goal Ferritin >200 -IV injectafer weekly x 2 doses -If Ferritin levels have corrected, will get BM Bx to r/o other reasons for anemia -Would consider erythropoietin injections if Ferritin well replaced and no other cause for anemia is found -Recommend pt receive COVID19 vaccine when available -Continue PO iron polysaccharide 150 mg BID -Continue 1000 IU of Vitamin B12 SL -Recommend B-complex Vitamin    FOLLOW UP: IV injectafer weekly x 2 doses RTC with Dr Irene Limbo with labs in 3 months   The total time spent in the appt was 20 minutes and more than 50% was on counseling and direct patient cares.  All of the patient's questions were answered with apparent satisfaction. The patient knows to call the clinic with any problems, questions or concerns.    Sullivan Lone MD [place] AAHIVMS [facility] Hematology/Oncology Physician [facility]  (Office):       [phone] (Work cell):  [phone] (Fax):           [phone]  02/02/2019 5:11 PM  I, Yevette Edwards, am acting as a scribe for Dr. Sullivan Lone.   .I have reviewed the above documentation for accuracy and completeness, and I agree with the above. Brunetta Genera MD

## 2019-02-02 ENCOUNTER — Inpatient Hospital Stay: Payer: Medicare HMO | Admitting: Hematology

## 2019-02-02 ENCOUNTER — Inpatient Hospital Stay: Payer: Medicare HMO | Attending: Hematology

## 2019-02-02 ENCOUNTER — Other Ambulatory Visit: Payer: Self-pay

## 2019-02-02 DIAGNOSIS — D508 Other iron deficiency anemias: Secondary | ICD-10-CM

## 2019-02-02 DIAGNOSIS — E785 Hyperlipidemia, unspecified: Secondary | ICD-10-CM | POA: Diagnosis not present

## 2019-02-02 DIAGNOSIS — Z79899 Other long term (current) drug therapy: Secondary | ICD-10-CM | POA: Diagnosis not present

## 2019-02-02 DIAGNOSIS — Z7984 Long term (current) use of oral hypoglycemic drugs: Secondary | ICD-10-CM | POA: Diagnosis not present

## 2019-02-02 DIAGNOSIS — I10 Essential (primary) hypertension: Secondary | ICD-10-CM | POA: Insufficient documentation

## 2019-02-02 DIAGNOSIS — D638 Anemia in other chronic diseases classified elsewhere: Secondary | ICD-10-CM

## 2019-02-02 DIAGNOSIS — R69 Illness, unspecified: Secondary | ICD-10-CM | POA: Diagnosis not present

## 2019-02-02 DIAGNOSIS — D649 Anemia, unspecified: Secondary | ICD-10-CM | POA: Insufficient documentation

## 2019-02-02 DIAGNOSIS — F329 Major depressive disorder, single episode, unspecified: Secondary | ICD-10-CM | POA: Diagnosis not present

## 2019-02-02 DIAGNOSIS — E119 Type 2 diabetes mellitus without complications: Secondary | ICD-10-CM | POA: Insufficient documentation

## 2019-02-02 LAB — CBC WITH DIFFERENTIAL/PLATELET
Abs Immature Granulocytes: 0.01 10*3/uL (ref 0.00–0.07)
Basophils Absolute: 0 10*3/uL (ref 0.0–0.1)
Basophils Relative: 1 %
Eosinophils Absolute: 0.1 10*3/uL (ref 0.0–0.5)
Eosinophils Relative: 1 %
HCT: 25 % — ABNORMAL LOW (ref 36.0–46.0)
Hemoglobin: 8.5 g/dL — ABNORMAL LOW (ref 12.0–15.0)
Immature Granulocytes: 0 %
Lymphocytes Relative: 27 %
Lymphs Abs: 1.6 10*3/uL (ref 0.7–4.0)
MCH: 30.8 pg (ref 26.0–34.0)
MCHC: 34 g/dL (ref 30.0–36.0)
MCV: 90.6 fL (ref 80.0–100.0)
Monocytes Absolute: 0.5 10*3/uL (ref 0.1–1.0)
Monocytes Relative: 9 %
Neutro Abs: 3.8 10*3/uL (ref 1.7–7.7)
Neutrophils Relative %: 62 %
Platelets: 317 10*3/uL (ref 150–400)
RBC: 2.76 MIL/uL — ABNORMAL LOW (ref 3.87–5.11)
RDW: 13.2 % (ref 11.5–15.5)
WBC: 6 10*3/uL (ref 4.0–10.5)
nRBC: 0 % (ref 0.0–0.2)

## 2019-02-02 LAB — VITAMIN B12: Vitamin B-12: 569 pg/mL (ref 180–914)

## 2019-02-03 ENCOUNTER — Telehealth: Payer: Self-pay | Admitting: Hematology

## 2019-02-03 LAB — IRON AND TIBC
Iron: 65 ug/dL (ref 41–142)
Saturation Ratios: 26 % (ref 21–57)
TIBC: 253 ug/dL (ref 236–444)
UIBC: 188 ug/dL (ref 120–384)

## 2019-02-03 LAB — FERRITIN: Ferritin: 77 ng/mL (ref 11–307)

## 2019-02-03 NOTE — Telephone Encounter (Signed)
No new los during check out.

## 2019-02-05 ENCOUNTER — Ambulatory Visit: Payer: Medicare HMO

## 2019-02-09 DIAGNOSIS — E1165 Type 2 diabetes mellitus with hyperglycemia: Secondary | ICD-10-CM | POA: Diagnosis not present

## 2019-02-09 DIAGNOSIS — E113313 Type 2 diabetes mellitus with moderate nonproliferative diabetic retinopathy with macular edema, bilateral: Secondary | ICD-10-CM | POA: Diagnosis not present

## 2019-02-09 DIAGNOSIS — H40052 Ocular hypertension, left eye: Secondary | ICD-10-CM | POA: Diagnosis not present

## 2019-02-09 DIAGNOSIS — H3411 Central retinal artery occlusion, right eye: Secondary | ICD-10-CM | POA: Diagnosis not present

## 2019-02-09 DIAGNOSIS — H35033 Hypertensive retinopathy, bilateral: Secondary | ICD-10-CM | POA: Diagnosis not present

## 2019-02-09 DIAGNOSIS — Z961 Presence of intraocular lens: Secondary | ICD-10-CM | POA: Diagnosis not present

## 2019-02-12 ENCOUNTER — Telehealth: Payer: Self-pay | Admitting: *Deleted

## 2019-02-12 DIAGNOSIS — D509 Iron deficiency anemia, unspecified: Secondary | ICD-10-CM | POA: Insufficient documentation

## 2019-02-12 NOTE — Telephone Encounter (Signed)
Called pt & informed of ferritin result & goal of 200 & need for weekly dose of injectafer x 2 doses.  She is in agreement & message sent to schedulers to schedule & call pt.

## 2019-02-12 NOTE — Telephone Encounter (Signed)
-----   Message from Rolland Bimler, RN sent at 02/12/2019  2:46 PM EST -----  ----- Message ----- From: Brunetta Genera, MD Sent: 02/12/2019  12:41 AM EST To: Rolland Bimler, RN  Plz let patient ferritin not at goal. Somewhat low at 77. Goal if >200 given his CKD -- recommend IV Injectafer weekly x 2 doses RTC wth Dr Irene Limbo in 2 months. IV Iron orders are in. Will need scheduling msg.

## 2019-02-13 ENCOUNTER — Ambulatory Visit: Payer: Medicare HMO | Attending: Internal Medicine

## 2019-02-13 DIAGNOSIS — Z23 Encounter for immunization: Secondary | ICD-10-CM | POA: Insufficient documentation

## 2019-02-13 NOTE — Progress Notes (Signed)
   Covid-19 Vaccination Clinic  Name:  Christine Strong    MRN: [id number] DOB: 09-30-1950  02/13/2019  [name] was observed post Covid-19 immunization for 15 minutes without incidence. She was provided with Vaccine Information Sheet and instruction to access the V-Safe system.   [name] was instructed to call 911 with any severe reactions post vaccine: Marland Kitchen Difficulty breathing  . Swelling of your face and throat  . A fast heartbeat  . A bad rash all over your body  . Dizziness and weakness    Immunizations Administered    Name Date Dose VIS Date Route   Pfizer COVID-19 Vaccine 02/13/2019  9:15 AM 0.3 mL 12/18/2018 Intramuscular   Manufacturer: [place]   Lot: [id number]   [place]: [id number]

## 2019-02-16 ENCOUNTER — Ambulatory Visit: Payer: Medicare HMO

## 2019-02-16 ENCOUNTER — Telehealth: Payer: Self-pay | Admitting: Hematology

## 2019-02-16 NOTE — Telephone Encounter (Signed)
Left message - called pt per 2/5 sch message - no answer . Left message with appt date and time

## 2019-02-17 DIAGNOSIS — R899 Unspecified abnormal finding in specimens from other organs, systems and tissues: Secondary | ICD-10-CM | POA: Diagnosis not present

## 2019-02-17 DIAGNOSIS — R944 Abnormal results of kidney function studies: Secondary | ICD-10-CM | POA: Diagnosis not present

## 2019-02-19 ENCOUNTER — Inpatient Hospital Stay: Payer: Medicare HMO | Attending: Hematology

## 2019-02-19 ENCOUNTER — Other Ambulatory Visit: Payer: Self-pay

## 2019-02-19 VITALS — BP 161/78 | HR 68 | Temp 98.2°F | Resp 18

## 2019-02-19 DIAGNOSIS — D508 Other iron deficiency anemias: Secondary | ICD-10-CM | POA: Diagnosis present

## 2019-02-19 MED ORDER — SODIUM CHLORIDE 0.9 % IV SOLN
Freq: Once | INTRAVENOUS | Status: AC
  Filled 2019-02-19: qty 250

## 2019-02-19 MED ORDER — SODIUM CHLORIDE 0.9 % IV SOLN
200.0000 mg | Freq: Once | INTRAVENOUS | Status: AC
  Administered 2019-02-19: 200 mg via INTRAVENOUS
  Filled 2019-02-19: qty 200

## 2019-02-19 NOTE — Patient Instructions (Signed)

## 2019-02-25 ENCOUNTER — Telehealth: Payer: Self-pay | Admitting: *Deleted

## 2019-02-25 NOTE — Telephone Encounter (Signed)
Pt called stating she received message to reschedule tomorrow's infusion appt.  Scheduling message sent high priority.

## 2019-02-25 NOTE — Progress Notes (Deleted)
RN called patient to reschedule appointment. Patient did not answer but RN left voicemail to return call.

## 2019-02-26 ENCOUNTER — Inpatient Hospital Stay: Payer: Medicare HMO

## 2019-02-27 ENCOUNTER — Inpatient Hospital Stay: Payer: Medicare HMO

## 2019-02-27 ENCOUNTER — Other Ambulatory Visit: Payer: Self-pay

## 2019-02-27 VITALS — BP 170/76 | HR 66 | Temp 97.9°F | Resp 17

## 2019-02-27 DIAGNOSIS — D508 Other iron deficiency anemias: Secondary | ICD-10-CM

## 2019-02-27 MED ORDER — SODIUM CHLORIDE 0.9 % IV SOLN
200.0000 mg | Freq: Once | INTRAVENOUS | Status: AC
  Administered 2019-02-27: 12:00:00 200 mg via INTRAVENOUS
  Filled 2019-02-27: qty 200

## 2019-02-27 MED ORDER — SODIUM CHLORIDE 0.9 % IV SOLN
Freq: Once | INTRAVENOUS | Status: AC
  Filled 2019-02-27: qty 250

## 2019-02-27 NOTE — Patient Instructions (Signed)

## 2019-03-03 ENCOUNTER — Telehealth: Payer: Self-pay | Admitting: Hematology

## 2019-03-03 NOTE — Telephone Encounter (Signed)
Scheduled appt per staff message from RN Sandi for two additional iv iron infusion . - unable to reach pt . Left message with appt date and time

## 2019-03-05 ENCOUNTER — Inpatient Hospital Stay: Payer: Medicare HMO

## 2019-03-05 ENCOUNTER — Other Ambulatory Visit: Payer: Self-pay

## 2019-03-05 VITALS — BP 160/71 | HR 67 | Temp 97.7°F | Resp 18

## 2019-03-05 DIAGNOSIS — D508 Other iron deficiency anemias: Secondary | ICD-10-CM | POA: Diagnosis not present

## 2019-03-05 MED ORDER — SODIUM CHLORIDE 0.9 % IV SOLN
200.0000 mg | Freq: Once | INTRAVENOUS | Status: AC
  Administered 2019-03-05: 200 mg via INTRAVENOUS
  Filled 2019-03-05: qty 200

## 2019-03-05 MED ORDER — SODIUM CHLORIDE 0.9 % IV SOLN
Freq: Once | INTRAVENOUS | Status: AC
  Filled 2019-03-05: qty 250

## 2019-03-05 NOTE — Patient Instructions (Signed)

## 2019-03-08 ENCOUNTER — Ambulatory Visit: Payer: Medicare HMO

## 2019-03-08 DIAGNOSIS — E889 Metabolic disorder, unspecified: Secondary | ICD-10-CM | POA: Diagnosis not present

## 2019-03-08 DIAGNOSIS — E875 Hyperkalemia: Secondary | ICD-10-CM | POA: Diagnosis not present

## 2019-03-08 DIAGNOSIS — D631 Anemia in chronic kidney disease: Secondary | ICD-10-CM | POA: Diagnosis not present

## 2019-03-08 DIAGNOSIS — N183 Chronic kidney disease, stage 3 unspecified: Secondary | ICD-10-CM | POA: Diagnosis not present

## 2019-03-08 DIAGNOSIS — N1832 Chronic kidney disease, stage 3b: Secondary | ICD-10-CM | POA: Diagnosis not present

## 2019-03-08 DIAGNOSIS — E1122 Type 2 diabetes mellitus with diabetic chronic kidney disease: Secondary | ICD-10-CM | POA: Diagnosis not present

## 2019-03-08 DIAGNOSIS — I129 Hypertensive chronic kidney disease with stage 1 through stage 4 chronic kidney disease, or unspecified chronic kidney disease: Secondary | ICD-10-CM | POA: Diagnosis not present

## 2019-03-08 DIAGNOSIS — E559 Vitamin D deficiency, unspecified: Secondary | ICD-10-CM | POA: Diagnosis not present

## 2019-03-08 DIAGNOSIS — M9080 Osteopathy in diseases classified elsewhere, unspecified site: Secondary | ICD-10-CM | POA: Diagnosis not present

## 2019-03-09 ENCOUNTER — Inpatient Hospital Stay: Payer: Medicare HMO | Attending: Hematology

## 2019-03-09 ENCOUNTER — Other Ambulatory Visit: Payer: Self-pay

## 2019-03-09 VITALS — BP 173/76 | HR 65 | Temp 98.0°F | Resp 16

## 2019-03-09 DIAGNOSIS — D508 Other iron deficiency anemias: Secondary | ICD-10-CM | POA: Diagnosis not present

## 2019-03-09 MED ORDER — SODIUM CHLORIDE 0.9 % IV SOLN
Freq: Once | INTRAVENOUS | Status: AC
  Filled 2019-03-09: qty 250

## 2019-03-09 MED ORDER — SODIUM CHLORIDE 0.9 % IV SOLN
200.0000 mg | Freq: Once | INTRAVENOUS | Status: AC
  Administered 2019-03-09: 200 mg via INTRAVENOUS
  Filled 2019-03-09: qty 200

## 2019-03-09 NOTE — Progress Notes (Signed)
Pt declined to stay for 30 minute post-observation  

## 2019-03-09 NOTE — Patient Instructions (Signed)

## 2019-03-10 ENCOUNTER — Ambulatory Visit: Payer: Medicare HMO | Attending: Internal Medicine

## 2019-03-10 DIAGNOSIS — Z23 Encounter for immunization: Secondary | ICD-10-CM | POA: Insufficient documentation

## 2019-03-10 NOTE — Progress Notes (Signed)
   Covid-19 Vaccination Clinic  Name:  Christine Strong    MRN: [id number] DOB: 1950/11/02  03/10/2019  Christine Strong was observed post Covid-19 immunization for 15 minutes without incident. She was provided with Vaccine Information Sheet and instruction to access the V-Safe system.   Christine Strong was instructed to call 911 with any severe reactions post vaccine: Marland Kitchen Difficulty breathing  . Swelling of face and throat  . A fast heartbeat  . A bad rash all over body  . Dizziness and weakness   Immunizations Administered    Name Date Dose VIS Date Route   Pfizer COVID-19 Vaccine 03/10/2019 10:11 AM 0.3 mL 12/18/2018 Intramuscular   Manufacturer: [place]   Lot: [id number]   [place]: [id number]

## 2019-03-11 DIAGNOSIS — D649 Anemia, unspecified: Secondary | ICD-10-CM | POA: Diagnosis not present

## 2019-03-11 DIAGNOSIS — A048 Other specified bacterial intestinal infections: Secondary | ICD-10-CM | POA: Diagnosis not present

## 2019-03-11 DIAGNOSIS — Z86010 Personal history of colonic polyps: Secondary | ICD-10-CM | POA: Diagnosis not present

## 2019-03-23 DIAGNOSIS — H3411 Central retinal artery occlusion, right eye: Secondary | ICD-10-CM | POA: Diagnosis not present

## 2019-03-23 DIAGNOSIS — Z961 Presence of intraocular lens: Secondary | ICD-10-CM | POA: Diagnosis not present

## 2019-03-23 DIAGNOSIS — H40052 Ocular hypertension, left eye: Secondary | ICD-10-CM | POA: Diagnosis not present

## 2019-03-23 DIAGNOSIS — E113313 Type 2 diabetes mellitus with moderate nonproliferative diabetic retinopathy with macular edema, bilateral: Secondary | ICD-10-CM | POA: Diagnosis not present

## 2019-03-23 DIAGNOSIS — H35033 Hypertensive retinopathy, bilateral: Secondary | ICD-10-CM | POA: Diagnosis not present

## 2019-03-30 DIAGNOSIS — A048 Other specified bacterial intestinal infections: Secondary | ICD-10-CM | POA: Diagnosis not present

## 2019-04-06 NOTE — Progress Notes (Signed)
HEMATOLOGY/ONCOLOGY CLINIC NOTE  Date of Service: 04/12/2019  Patient Care Team: Rankins, Bill Salinas, MD as PCP - General (Family Medicine) Leonie Man, MD as PCP - Cardiology (Cardiology)  CHIEF COMPLAINTS/PURPOSE OF CONSULTATION:  Anemia  HISTORY OF PRESENTING ILLNESS:   Christine Strong is a wonderful 69 y.o. female who has been referred to [place] by Dr Alessandra Bevels for evaluation and management of anemia. The pt reports that she is doing well overall.  The pt reports that she went to the Nephrologist near the beginning of the year and that's when he brought up his concern about her anemia. She reports that he thought that her anemia was due to her Eylea shots, which are only intraoccular injections. She has been receiving these since 2017. Dr Alessandra Bevels, her GI, thought that her anemia could have been due to her Helicobacter pylori infection. She completed her antibiotics and denies having a second stool test to confirm the absence of Helicobacter pylori. Her abdominal pain from the infection has resolved.   Pt reports that her Nephrologist thinks that her proteinuria is associated with her diabetes. Pt has been regularly taking her Metformin for her diabetes, which was diagnosed in 2016. She does not have to take insulin and it is currently well controlled. She is already on a Vitamin B12 replacement which she takes intermittently. Pt is unsure if she was actually Vitamin B12 deficient but notes that supplementation did improve her energy. Pt is currently taking a PO iron supplement. She has been on an acid suppressant since the discovery of her Helicobacter pylori infection.  She has noticed some increased fatigue compared to 6 months ago but also notes that she has significanly lowered her intake of caffeine in the last 6 months. She has been walking more lately which has been helping with her energy levels. Pt reports that she has been feeling more depressed due to isolation caused by  imposed social distancing. She is up to date on her age appropriate cancer screenings.   Pt reports that at her heaviest she used to drink 2-3 glasses of wine per day but that she is now down to 4-5 glasses per week.  Of note prior to the patient's visit today, pt has had EGD completed on 03/25/2018 with results revealing "1. Sm Intestine-Duodenum, Bx: SMALL BOWEL MUCOSA WITH NO SIGNIFICANT PATHOLOGIC CHANGES. Negative for features of Celiac disease. Giardia lamblia organisms not present. 2. Stomach - Antrum, Bx: ACTIVE CHRONIC HELICOBACTER PYLORI ASSOCIATED GASTRITIS. POSITIVE for Helicobacter pylori organisms on IP stain. 3. LG Intestine, Random Colon Bx: COLONIC MUCOSA WITH NO SIGNIFICANT PATHOLOGIC CHANGES. Negative for lymphocytic/collagenous colitis, active colitis or dysplasia. 4. LG Intestine- Descending Colon, Rectum, Polyp: TUBULAR ADENOMAS (2)."  Pt has had Colonoscopy completed on 03/25/2018 with results revealing " -Hemorrhoids found on perianal exam. - The examined portion of the ileum was normal. - Normal mucose in the entire examined colon, Biopsied. - One 4 mm polyp in the descending colon, removed with cold snare. Resected and retrieved. - One 8 mm polyp in the rectum, removed with a cold snare. Resected and removed. - Significant colonic spasm."  Most recent lab results (08/18/2018) of CBC w/diff is as follows: WBC at 5.8K, RBC at 2.96, Hgb at 9.0, HCT at 27.1, MCV at 91.5, MCH at 30.5, MCHC at 33.4, RDW at 13.6, Platelets at 476K, MPV at 7.0, Neutro Rel at 65.9, Lymphs Rel at 24.1, Mono Rel at 7.9, Eos Rel at 1.1, Baso Rel at 1.0, Neutro  Abs at 3.8K, Lymphs Abs at 1.40, Mono Abs at 0.5  On review of systems, pt reports fatigue, depression and denies fevers, chills, unexpected night sweats, unexpected weight loss, mouth sores, new bone pains, abdominal pain, back pain, skin rashes and any other symptoms.   On PMHx the pt reports Diabetes, HTN, HLD, central retinal artery occlusion  (right). On Social Hx the pt reports non smoker, 4-5 glasses of wine per week. On Family Hx the pt reports father who passed from lung cancer.  INTERVAL HISTORY:   Christine Strong is a wonderful 69 y.o. female who is here for evaluation and management of anemia. The patient's last visit with [place] was on 02/02/2019. The pt reports that she is doing well overall.  The pt reports that she has felt well in the interim and has had some improvement in fatigue. She denies any issues with her IV Iron infusions. Pt saw Dr. Alessandra Bevels who did stool testing and found no occult blood. Pt has not noticed any bloody/black stools or other abnormal bleeding. She has continued taking PO Iron and Vitamin B12 as recommended.   Pt has had both doses of the COVID19 vaccine and tolerated them well.   Lab results today (04/12/19) of CBC w/diff and CMP is as follows: all values are WNL except for RBC at 2.81, Hgb at 8.6, HCT at 26.5, CO2 at 21, Glucose at 165, BUN at 50, Creatinine at 1.62, AST at 11, GFR Est Non Af Am at 33. 04/12/2019 Iron and TIBC is as follows: Iron at 84, TIBC at 244, Sat Ratios at 34, UIBC at 160 04/12/2019 Ferritin at 194  On review of systems, pt reports improving fatigue and denies bloody/black stools, abnormal bleeding, unexpected weight loss, abdominal pain, leg swelling, new bone pain and any other symptoms.    MEDICAL HISTORY:  Past Medical History:  Diagnosis Date  . Diabetes mellitus with neuropathy ([place])    On metformin  . Hyperlipidemia    On atorvastatin 20 mg  . Hypertension    On nifedipine 60 mg along with Lopressor 12.5 twice daily  . Mild aortic stenosis by prior echocardiogram 02/2018   Moderate aortic valve thickening-mild stenosis (mean gradient 9.3 mmHg)    SURGICAL HISTORY: Past Surgical History:  Procedure Laterality Date  . CATARACT EXTRACTION Left 12/19/2017  . TRANSTHORACIC ECHOCARDIOGRAM  03/06/2018   Mildly increased LV thickness.  EF 60-65%.  Now RWMA  (read as restrictive filling pattern--on my review this is clearly incorrect, is more grade 1 diastolic dysfunction).  Mild LA dilation.  Moderate mitral valve thickening and calcification but no stenosis or regurgitation.  Moderate aortic valve thickening-mild aortic stenosis.    SOCIAL HISTORY: Social History   Socioeconomic History  . Marital status: Divorced    Spouse name: Not on file  . Number of children: 0  . Years of education: Not on file  . Highest education level: Not on file  Occupational History  . Occupation: Hostess/waitress    Employer: [employer]  Tobacco Use  . Smoking status: Never Smoker  . Smokeless tobacco: Never Used  Substance and Sexual Activity  . Alcohol use: Yes    Alcohol/week: 7.0 standard drinks    Types: 7 Glasses of wine per week    Comment: Maybe 1 glass of wine a week  . Drug use: Never  . Sexual activity: Not on file  Other Topics Concern  . Not on file  Social History Narrative   She works for [employer]  Catering as a hostess/waitress.   Never smoked.  Enjoys a glass of wine a day.      Does not necessarily get routine exercise because she is tired and work.   Social Determinants of Health   Financial Resource Strain:   . Difficulty of Paying Living Expenses:   Food Insecurity:   . Worried About Charity fundraiser in the Last Year:   . Arboriculturist in the Last Year:   Transportation Needs:   . Film/video editor (Medical):   Marland Kitchen Lack of Transportation (Non-Medical):   Physical Activity:   . Days of Exercise per Week:   . Minutes of Exercise per Session:   Stress:   . Feeling of Stress :   Social Connections:   . Frequency of Communication with Friends and Family:   . Frequency of Social Gatherings with Friends and Family:   . Attends Religious Services:   . Active Member of Clubs or Organizations:   . Attends Archivist Meetings:   Marland Kitchen Marital Status:   Intimate Partner Violence:   . Fear of Current or  Ex-Partner:   . Emotionally Abused:   Marland Kitchen Physically Abused:   . Sexually Abused:     FAMILY HISTORY: Family History  Problem Relation Age of Onset  . Diabetes Mellitus II Mother   . Hypertension Mother   . Diabetes Mellitus II Father   . Diabetes Mellitus II Brother     ALLERGIES:  has No Known Allergies.  MEDICATIONS:  Current Outpatient Medications  Medication Sig Dispense Refill  . atorvastatin (LIPITOR) 80 MG tablet Take by mouth.    . brimonidine (ALPHAGAN) 0.2 % ophthalmic solution     . Cholecalciferol (VITAMIN D-1000 MAX ST) 25 MCG (1000 UT) tablet Take by mouth.    . Cyanocobalamin (VITAMIN B 12 PO) Take 2 tablets by mouth daily.    . dorzolamide (TRUSOPT) 2 % ophthalmic solution     . ergocalciferol (VITAMIN D2) 1.25 MG (50000 UT) capsule Take by mouth.    . iron polysaccharides (NIFEREX) 150 MG capsule Take 1 capsule (150 mg total) by mouth daily. 60 capsule 3  . lisinopril (ZESTRIL) 40 MG tablet     . Melatonin 3 MG TABS 3 mg nightly.    . metformin (FORTAMET) 1000 MG (OSM) 24 hr tablet Take 1 tablet (1,000 mg total) by mouth 2 (two) times daily with a meal. 60 tablet 0  . metoprolol tartrate (LOPRESSOR) 25 MG tablet TAKE ONE TABLET BY MOUTH TWICE A DAY (REPLACES LABETALOL)    . NIFEdipine (PROCARDIA XL/NIFEDICAL XL) 60 MG 24 hr tablet Take by mouth.    . pantoprazole (PROTONIX) 40 MG tablet 40 mg daily.     . timolol (TIMOPTIC) 0.5 % ophthalmic solution      No current facility-administered medications for this visit.    REVIEW OF SYSTEMS:   A 10+ POINT REVIEW OF SYSTEMS WAS OBTAINED including neurology, dermatology, psychiatry, cardiac, respiratory, lymph, extremities, GI, GU, Musculoskeletal, constitutional, breasts, reproductive, HEENT.  All pertinent positives are noted in the HPI.  All others are negative.   PHYSICAL EXAMINATION: ECOG PERFORMANCE STATUS: 1 - Symptomatic but completely ambulatory  . Vitals:   04/12/19 0859  BP: (!) 141/68  Pulse: 64    Resp: 18  Temp: 98 F (36.7 C)  SpO2: 100%   Filed Weights   04/12/19 0859  Weight: 138 lb 12.8 oz (63 kg)   .Body mass index is 22.75 kg/m.  GENERAL:alert, in no acute distress and comfortable SKIN: no acute rashes, no significant lesions EYES: conjunctiva are pink and non-injected, sclera anicteric OROPHARYNX: MMM, no exudates, no oropharyngeal erythema or ulceration NECK: supple, no JVD LYMPH:  no palpable lymphadenopathy in the cervical, axillary or inguinal regions LUNGS: clear to auscultation b/l with normal respiratory effort HEART: regular rate & rhythm ABDOMEN:  normoactive bowel sounds , non tender, not distended. No palpable hepatosplenomegaly.  Extremity: no pedal edema PSYCH: alert & oriented x 3 with fluent speech NEURO: no focal motor/sensory deficits  LABORATORY DATA:  I have reviewed the data as listed  . CBC Latest Ref Rng & Units 04/12/2019 02/02/2019 09/21/2018  WBC 4.0 - 10.5 K/uL 5.9 6.0 5.5  Hemoglobin 12.0 - 15.0 g/dL 8.6(L) 8.5(L) 9.3(L)  Hematocrit 36.0 - 46.0 % 26.5(L) 25.0(L) 28.2(L)  Platelets 150 - 400 K/uL 316 317 347    . CBC Latest Ref Rng & Units 04/12/2019 02/02/2019 09/21/2018  WBC 4.0 - 10.5 K/uL 5.9 6.0 5.5  Hemoglobin 12.0 - 15.0 g/dL 8.6(L) 8.5(L) 9.3(L)  Hematocrit 36.0 - 46.0 % 26.5(L) 25.0(L) 28.2(L)  Platelets 150 - 400 K/uL 316 317 347   .CBC    Component Value Date/Time   WBC 5.9 04/12/2019 0808   WBC 6.0 02/02/2019 1432   RBC 2.81 (L) 04/12/2019 0808   HGB 8.6 (L) 04/12/2019 0808   HCT 26.5 (L) 04/12/2019 0808   HCT 28.2 (L) 09/21/2018 1213   PLT 316 04/12/2019 0808   MCV 94.3 04/12/2019 0808   MCH 30.6 04/12/2019 0808   MCHC 32.5 04/12/2019 0808   RDW 13.3 04/12/2019 0808   LYMPHSABS 1.9 04/12/2019 0808   MONOABS 0.5 04/12/2019 0808   EOSABS 0.1 04/12/2019 0808   BASOSABS 0.0 04/12/2019 0808    . CMP Latest Ref Rng & Units 04/12/2019 09/21/2018 12/05/2013  Glucose 70 - 99 mg/dL 165(H) 100(H) 117(H)  BUN 8 - 23  mg/dL 50(H) 53(H) 21  Creatinine 0.44 - 1.00 mg/dL 1.62(H) 1.68(H) 0.57  Sodium 135 - 145 mmol/L 141 139 140  Potassium 3.5 - 5.1 mmol/L 4.6 4.9 4.9  Chloride 98 - 111 mmol/L 109 107 102  CO2 22 - 32 mmol/L 21(L) 22 24  Calcium 8.9 - 10.3 mg/dL 9.1 9.5 9.7  Total Protein 6.5 - 8.1 g/dL 7.0 7.6 -  Total Bilirubin 0.3 - 1.2 mg/dL 0.4 0.4 -  Alkaline Phos 38 - 126 U/L 46 49 -  AST 15 - 41 U/L 11(L) 13(L) -  ALT 0 - 44 U/L 12 9 -   . Lab Results  Component Value Date   IRON 84 04/12/2019   TIBC 244 04/12/2019   IRONPCTSAT 34 04/12/2019   (Iron and TIBC)  Lab Results  Component Value Date   FERRITIN 194 04/12/2019    03/25/2018 Colonoscopy    03/25/2018 EDG    RADIOGRAPHIC STUDIES: I have personally reviewed the radiological images as listed and agreed with the findings in the report. No results found.  ASSESSMENT & PLAN:   1) Normocytic Anemia   03/25/2018 EGD which revealed "1. Sm Intestine-Duodenum, Bx: SMALL BOWEL MUCOSA WITH NO SIGNIFICANT PATHOLOGIC CHANGES. Negative for features of Celiac disease. Giardia lamblia organisms not present. 2. Stomach - Antrum, Bx: ACTIVE CHRONIC HELICOBACTER PYLORI ASSOCIATED GASTRITIS. POSITIVE for Helicobacter pylori organisms on IP stain. 3. LG Intestine, Random Colon Bx: COLONIC MUCOSA WITH NO SIGNIFICANT PATHOLOGIC CHANGES. Negative for lymphocytic/collagenous colitis, active colitis or dysplasia. 4. LG Intestine- Descending Colon, Rectum, Polyp: TUBULAR ADENOMAS (  2)."  03/25/2018 Colonoscopy which revealed " -Hemorrhoids found on perianal exam. - The examined portion of the ileum was normal. - Normal mucose in the entire examined colon, Biopsied. - One 4 mm polyp in the descending colon, removed with cold snare. Resected and retrieved. - One 8 mm polyp in the rectum, removed with a cold snare. Resected and removed. - Significant colonic spasm."  PLAN: -Discussed pt labwork today, 04/12/19; blood counts stable, all values are WNL  except for RBC at 2.81, Hgb at 8.6, HCT at 26.5, CO2 at 21, Glucose at 165, BUN at 50, Creatinine at 1.62, AST at 11, GFR Est Non Af Am at 33. -Discussed 04/12/2019 Iron and TIBC is as follows: Iron at 84, TIBC at 244, Sat Ratios at 34, UIBC at 160 -Discussed 04/12/2019 Ferritin at 194 -Goal Ferritin is 200-250 in the setting of CKD -Advised pt that her anemia is due, in part, to her CKD -Discussed beginning pt on ESA vs BM Bx to r/o other causes of anemia vs watching with labs if pt remains significantly anemic at goal Ferritin levels  -Continue PO iron polysaccharide 150 mg BID -Continue 1000 IU of Vitamin B12 SL -Will get CT BM Bx in 3 weeks  -Will see back in 4 weeks via phone   FOLLOW UP: CT bone marrow aspiration and biopsy in 3 weeks PHONE VISIT with Dr Irene Limbo in 4 weeks   The total time spent in the appt was 30 minutes and more than 50% was on counseling and direct patient cares.  All of the patient's questions were answered with apparent satisfaction. The patient knows to call the clinic with any problems, questions or concerns.   Sullivan Lone MD [place] AAHIVMS [facility] Hematology/Oncology Physician [facility]  (Office):       [phone] (Work cell):  [phone] (Fax):           [phone]  04/12/2019 10:15 AM  I, Yevette Edwards, am acting as a scribe for Dr. Sullivan Lone.   .I have reviewed the above documentation for accuracy and completeness, and I agree with the above. Brunetta Genera MD

## 2019-04-09 ENCOUNTER — Other Ambulatory Visit: Payer: Self-pay | Admitting: *Deleted

## 2019-04-09 ENCOUNTER — Telehealth: Payer: Self-pay | Admitting: Hematology

## 2019-04-09 DIAGNOSIS — D508 Other iron deficiency anemias: Secondary | ICD-10-CM

## 2019-04-09 NOTE — Telephone Encounter (Signed)
Scheduled appt per 4/2 sch message - unable to reach pt - left message with appt

## 2019-04-12 ENCOUNTER — Inpatient Hospital Stay: Payer: Medicare HMO

## 2019-04-12 ENCOUNTER — Other Ambulatory Visit: Payer: Self-pay

## 2019-04-12 ENCOUNTER — Inpatient Hospital Stay: Payer: Medicare HMO | Attending: Hematology | Admitting: Hematology

## 2019-04-12 VITALS — BP 141/68 | HR 64 | Temp 98.0°F | Resp 18 | Ht 65.5 in | Wt 138.8 lb

## 2019-04-12 DIAGNOSIS — Z7984 Long term (current) use of oral hypoglycemic drugs: Secondary | ICD-10-CM | POA: Insufficient documentation

## 2019-04-12 DIAGNOSIS — Z79899 Other long term (current) drug therapy: Secondary | ICD-10-CM | POA: Insufficient documentation

## 2019-04-12 DIAGNOSIS — E119 Type 2 diabetes mellitus without complications: Secondary | ICD-10-CM | POA: Insufficient documentation

## 2019-04-12 DIAGNOSIS — E785 Hyperlipidemia, unspecified: Secondary | ICD-10-CM | POA: Insufficient documentation

## 2019-04-12 DIAGNOSIS — I10 Essential (primary) hypertension: Secondary | ICD-10-CM | POA: Diagnosis not present

## 2019-04-12 DIAGNOSIS — D508 Other iron deficiency anemias: Secondary | ICD-10-CM | POA: Insufficient documentation

## 2019-04-12 DIAGNOSIS — D649 Anemia, unspecified: Secondary | ICD-10-CM

## 2019-04-12 LAB — CBC WITH DIFFERENTIAL (CANCER CENTER ONLY)
Abs Immature Granulocytes: 0.01 10*3/uL (ref 0.00–0.07)
Basophils Absolute: 0 10*3/uL (ref 0.0–0.1)
Basophils Relative: 1 %
Eosinophils Absolute: 0.1 10*3/uL (ref 0.0–0.5)
Eosinophils Relative: 2 %
HCT: 26.5 % — ABNORMAL LOW (ref 36.0–46.0)
Hemoglobin: 8.6 g/dL — ABNORMAL LOW (ref 12.0–15.0)
Immature Granulocytes: 0 %
Lymphocytes Relative: 33 %
Lymphs Abs: 1.9 10*3/uL (ref 0.7–4.0)
MCH: 30.6 pg (ref 26.0–34.0)
MCHC: 32.5 g/dL (ref 30.0–36.0)
MCV: 94.3 fL (ref 80.0–100.0)
Monocytes Absolute: 0.5 10*3/uL (ref 0.1–1.0)
Monocytes Relative: 9 %
Neutro Abs: 3.2 10*3/uL (ref 1.7–7.7)
Neutrophils Relative %: 55 %
Platelet Count: 316 10*3/uL (ref 150–400)
RBC: 2.81 MIL/uL — ABNORMAL LOW (ref 3.87–5.11)
RDW: 13.3 % (ref 11.5–15.5)
WBC Count: 5.9 10*3/uL (ref 4.0–10.5)
nRBC: 0 % (ref 0.0–0.2)

## 2019-04-12 LAB — CMP (CANCER CENTER ONLY)
ALT: 12 U/L (ref 0–44)
AST: 11 U/L — ABNORMAL LOW (ref 15–41)
Albumin: 3.6 g/dL (ref 3.5–5.0)
Alkaline Phosphatase: 46 U/L (ref 38–126)
Anion gap: 11 (ref 5–15)
BUN: 50 mg/dL — ABNORMAL HIGH (ref 8–23)
CO2: 21 mmol/L — ABNORMAL LOW (ref 22–32)
Calcium: 9.1 mg/dL (ref 8.9–10.3)
Chloride: 109 mmol/L (ref 98–111)
Creatinine: 1.62 mg/dL — ABNORMAL HIGH (ref 0.44–1.00)
GFR, Est AFR Am: 38 mL/min — ABNORMAL LOW (ref >60)
GFR, Estimated: 33 mL/min — ABNORMAL LOW (ref >60)
Glucose, Bld: 165 mg/dL — ABNORMAL HIGH (ref 70–99)
Potassium: 4.6 mmol/L (ref 3.5–5.1)
Sodium: 141 mmol/L (ref 135–145)
Total Bilirubin: 0.4 mg/dL (ref 0.3–1.2)
Total Protein: 7 g/dL (ref 6.5–8.1)

## 2019-04-12 LAB — IRON AND TIBC
Iron: 84 ug/dL (ref 41–142)
Saturation Ratios: 34 % (ref 21–57)
TIBC: 244 ug/dL (ref 236–444)
UIBC: 160 ug/dL (ref 120–384)

## 2019-04-12 LAB — FERRITIN: Ferritin: 194 ng/mL (ref 11–307)

## 2019-04-22 ENCOUNTER — Telehealth: Payer: Self-pay | Admitting: Hematology

## 2019-04-22 NOTE — Telephone Encounter (Signed)
Scheduled per 04/05 los, patient has been called and voicemail was left.

## 2019-04-29 ENCOUNTER — Other Ambulatory Visit: Payer: Self-pay | Admitting: Student

## 2019-05-03 ENCOUNTER — Encounter (HOSPITAL_COMMUNITY): Payer: Self-pay

## 2019-05-03 ENCOUNTER — Ambulatory Visit (HOSPITAL_COMMUNITY)
Admission: RE | Admit: 2019-05-03 | Discharge: 2019-05-03 | Disposition: A | Discharge status: Home / Self Care | Payer: Medicare HMO | Source: Ambulatory Visit | Attending: Hematology | Admitting: Hematology

## 2019-05-03 ENCOUNTER — Other Ambulatory Visit: Payer: Self-pay

## 2019-05-03 DIAGNOSIS — I129 Hypertensive chronic kidney disease with stage 1 through stage 4 chronic kidney disease, or unspecified chronic kidney disease: Secondary | ICD-10-CM | POA: Insufficient documentation

## 2019-05-03 DIAGNOSIS — N189 Chronic kidney disease, unspecified: Secondary | ICD-10-CM | POA: Diagnosis not present

## 2019-05-03 DIAGNOSIS — D644 Congenital dyserythropoietic anemia: Secondary | ICD-10-CM | POA: Diagnosis not present

## 2019-05-03 DIAGNOSIS — Z7984 Long term (current) use of oral hypoglycemic drugs: Secondary | ICD-10-CM | POA: Diagnosis not present

## 2019-05-03 DIAGNOSIS — D508 Other iron deficiency anemias: Secondary | ICD-10-CM

## 2019-05-03 DIAGNOSIS — Z833 Family history of diabetes mellitus: Secondary | ICD-10-CM | POA: Insufficient documentation

## 2019-05-03 DIAGNOSIS — E1140 Type 2 diabetes mellitus with diabetic neuropathy, unspecified: Secondary | ICD-10-CM | POA: Diagnosis not present

## 2019-05-03 DIAGNOSIS — D649 Anemia, unspecified: Secondary | ICD-10-CM | POA: Insufficient documentation

## 2019-05-03 DIAGNOSIS — Z8249 Family history of ischemic heart disease and other diseases of the circulatory system: Secondary | ICD-10-CM | POA: Insufficient documentation

## 2019-05-03 DIAGNOSIS — D6489 Other specified anemias: Secondary | ICD-10-CM | POA: Diagnosis not present

## 2019-05-03 DIAGNOSIS — E785 Hyperlipidemia, unspecified: Secondary | ICD-10-CM | POA: Insufficient documentation

## 2019-05-03 DIAGNOSIS — E1122 Type 2 diabetes mellitus with diabetic chronic kidney disease: Secondary | ICD-10-CM | POA: Diagnosis not present

## 2019-05-03 LAB — CBC WITH DIFFERENTIAL/PLATELET
Abs Immature Granulocytes: 0.01 10*3/uL (ref 0.00–0.07)
Basophils Absolute: 0 10*3/uL (ref 0.0–0.1)
Basophils Relative: 1 %
Eosinophils Absolute: 0.1 10*3/uL (ref 0.0–0.5)
Eosinophils Relative: 1 %
HCT: 27.9 % — ABNORMAL LOW (ref 36.0–46.0)
Hemoglobin: 9.1 g/dL — ABNORMAL LOW (ref 12.0–15.0)
Immature Granulocytes: 0 %
Lymphocytes Relative: 26 %
Lymphs Abs: 1.5 10*3/uL (ref 0.7–4.0)
MCH: 31.6 pg (ref 26.0–34.0)
MCHC: 32.6 g/dL (ref 30.0–36.0)
MCV: 96.9 fL (ref 80.0–100.0)
Monocytes Absolute: 0.5 10*3/uL (ref 0.1–1.0)
Monocytes Relative: 10 %
Neutro Abs: 3.5 10*3/uL (ref 1.7–7.7)
Neutrophils Relative %: 62 %
Platelets: 343 10*3/uL (ref 150–400)
RBC: 2.88 MIL/uL — ABNORMAL LOW (ref 3.87–5.11)
RDW: 13.3 % (ref 11.5–15.5)
WBC: 5.6 10*3/uL (ref 4.0–10.5)
nRBC: 0 % (ref 0.0–0.2)

## 2019-05-03 LAB — GLUCOSE, CAPILLARY: Glucose-Capillary: 123 mg/dL — ABNORMAL HIGH (ref 70–99)

## 2019-05-03 MED ORDER — SODIUM CHLORIDE 0.9 % IV SOLN: INTRAVENOUS | Status: DC

## 2019-05-03 MED ORDER — NALOXONE HCL 0.4 MG/ML IJ SOLN
INTRAMUSCULAR | Status: AC
  Filled 2019-05-03: qty 1

## 2019-05-03 MED ORDER — FENTANYL CITRATE (PF) 100 MCG/2ML IJ SOLN
INTRAMUSCULAR | Status: AC | PRN
  Administered 2019-05-03: 50 ug via INTRAVENOUS

## 2019-05-03 MED ORDER — FENTANYL CITRATE (PF) 100 MCG/2ML IJ SOLN
INTRAMUSCULAR | Status: AC
  Filled 2019-05-03: qty 4

## 2019-05-03 NOTE — Procedures (Signed)
Interventional Radiology Procedure Note  Procedure: CT guided aspirate and core biopsy of left posterior iliac bone Complications: None Recommendations: - Bedrest supine - OTC's PRN  Pain - Follow biopsy results  Signed,  Dulcy Fanny. Earleen Newport, DO

## 2019-05-03 NOTE — Discharge Instructions (Addendum)
Please call Interventional Radiology clinic [phone] with any questions or concerns.  You may remove your dressing and shower tomorrow.   Bone Marrow Aspiration and Bone Marrow Biopsy, Adult, Care After This sheet gives you information about how to care for yourself after your procedure. Your health care provider may also give you more specific instructions. If you have problems or questions, contact your health care provider. What can I expect after the procedure? After the procedure, it is common to have:  Mild pain and tenderness.  Swelling.  Bruising. Follow these instructions at home: Puncture site care   Follow instructions from your health care provider about how to take care of the puncture site. Make sure you: ? Wash your hands with soap and water before and after you change your bandage (dressing). If soap and water are not available, use hand sanitizer. ? Change your dressing as told by your health care provider.  Check your puncture site every day for signs of infection. Check for: ? More redness, swelling, or pain. ? Fluid or blood. ? Warmth. ? Pus or a bad smell. Activity  Return to your normal activities as told by your health care provider. Ask your health care provider what activities are safe for you.  Do not lift anything that is heavier than 10 lb (4.5 kg), or the limit that you are told, until your health care provider says that it is safe.  Do not drive for 24 hours if you were given a sedative during your procedure. General instructions   Take over-the-counter and prescription medicines only as told by your health care provider.  Do not take baths, swim, or use a hot tub until your health care provider approves. Ask your health care provider if you may take showers. You may only be allowed to take sponge baths.  If directed, put ice on the affected area. To do this: ? Put ice in a plastic bag. ? Place a towel between your skin and the  bag. ? Leave the ice on for 20 minutes, 2-3 times a day.  Keep all follow-up visits as told by your health care provider. This is important. Contact a health care provider if:  Your pain is not controlled with medicine.  You have a fever.  You have more redness, swelling, or pain around the puncture site.  You have fluid or blood coming from the puncture site.  Your puncture site feels warm to the touch.  You have pus or a bad smell coming from the puncture site. Summary  After the procedure, it is common to have mild pain, tenderness, swelling, and bruising.  Follow instructions from your health care provider about how to take care of the puncture site and what activities are safe for you.  Take over-the-counter and prescription medicines only as told by your health care provider.  Contact a health care provider if you have any signs of infection, such as fluid or blood coming from the puncture site. This information is not intended to replace advice given to you by your health care provider. Make sure you discuss any questions you have with your health care provider. Document Revised: 05/12/2018 Document Reviewed: 05/12/2018 Elsevier Patient Education  [place].   Moderate Conscious Sedation, Adult, Care After These instructions provide you with information about caring for yourself after your procedure. Your health care provider may also give you more specific instructions. Your treatment has been planned according to current medical practices, but problems sometimes occur. Call  your health care provider if you have any problems or questions after your procedure. What can I expect after the procedure? After your procedure, it is common:  To feel sleepy for several hours.  To feel clumsy and have poor balance for several hours.  To have poor judgment for several hours.  To vomit if you eat too soon. Follow these instructions at home: For at least 24 hours after  the procedure:   Do not: ? Participate in activities where you could fall or become injured. ? Drive. ? Use heavy machinery. ? Drink alcohol. ? Take sleeping pills or medicines that cause drowsiness. ? Make important decisions or sign legal documents. ? Take care of children on your own.  Rest. Eating and drinking  Follow the diet recommended by your health care provider.  If you vomit: ? Drink water, juice, or soup when you can drink without vomiting. ? Make sure you have little or no nausea before eating solid foods. General instructions  Have a responsible adult stay with you until you are awake and alert.  Take over-the-counter and prescription medicines only as told by your health care provider.  If you smoke, do not smoke without supervision.  Keep all follow-up visits as told by your health care provider. This is important. Contact a health care provider if:  You keep feeling nauseous or you keep vomiting.  You feel light-headed.  You develop a rash.  You have a fever. Get help right away if:  You have trouble breathing. This information is not intended to replace advice given to you by your health care provider. Make sure you discuss any questions you have with your health care provider. Document Revised: 12/06/2016 Document Reviewed: 04/15/2015 Elsevier Patient Education  2020 [employer].

## 2019-05-03 NOTE — H&P (Signed)
Chief Complaint: Patient was seen in consultation today for persistent anemia  Referring Physician(s): Brunetta Genera  Supervising Physician: Corrie Mckusick  Patient Status: [facility] - Out-pt  History of Present Illness: Christine Strong is a 69 y.o. female with past medical history of DM, HLD, HTN, chronic renal disease followed by Nephrology, and H pylori infection followed by GI who has had persistent, refractory anemia.  Patient with potential multifactorial etiology due to her medical history.  She has required PO supplementation as well as IV iron infusions, however her Hgb remains 8.6 on last assessment (04/12/19).  She is referred for bone marrow biopsy at the request of Dr. Irene Limbo.   Christine Strong presents for procedure today in her usual state of health.  She did have some black coffee for breakfast this AM.  She does not take blood thinners.   Past Medical History:  Diagnosis Date  . Diabetes mellitus with neuropathy ([place])    On metformin  . Hyperlipidemia    On atorvastatin 20 mg  . Hypertension    On nifedipine 60 mg along with Lopressor 12.5 twice daily  . Mild aortic stenosis by prior echocardiogram 02/2018   Moderate aortic valve thickening-mild stenosis (mean gradient 9.3 mmHg)    Past Surgical History:  Procedure Laterality Date  . CATARACT EXTRACTION Left 12/19/2017  . TRANSTHORACIC ECHOCARDIOGRAM  03/06/2018   Mildly increased LV thickness.  EF 60-65%.  Now RWMA (read as restrictive filling pattern--on my review this is clearly incorrect, is more grade 1 diastolic dysfunction).  Mild LA dilation.  Moderate mitral valve thickening and calcification but no stenosis or regurgitation.  Moderate aortic valve thickening-mild aortic stenosis.    Allergies: Patient has no known allergies.  Medications: Prior to Admission medications   Medication Sig Start Date End Date Taking? Authorizing Provider  brimonidine (ALPHAGAN) 0.2 % ophthalmic solution  08/27/18  Yes  [provider]  Cholecalciferol (VITAMIN D-1000 MAX ST) 25 MCG (1000 UT) tablet Take by mouth. 01/02/18  Yes [provider]  Cyanocobalamin (VITAMIN B 12 PO) Take 2 tablets by mouth daily.   Yes [provider]  dorzolamide (TRUSOPT) 2 % ophthalmic solution  08/27/18  Yes [provider]  iron polysaccharides (NIFEREX) 150 MG capsule Take 1 capsule (150 mg total) by mouth daily. 10/05/18  Yes Brunetta Genera, MD  atorvastatin (LIPITOR) 80 MG tablet Take by mouth. 11/12/16   [provider]  ergocalciferol (VITAMIN D2) 1.25 MG (50000 UT) capsule Take by mouth. 12/14/18   [provider]  lisinopril (ZESTRIL) 40 MG tablet  10/03/18   [provider]  Melatonin 3 MG TABS 3 mg nightly.    [provider]  metformin (FORTAMET) 1000 MG (OSM) 24 hr tablet Take 1 tablet (1,000 mg total) by mouth 2 (two) times daily with a meal. 12/05/13   Moding, Langley Gauss, MD  metoprolol tartrate (LOPRESSOR) 25 MG tablet TAKE ONE TABLET BY MOUTH TWICE A DAY (REPLACES LABETALOL) 12/22/17   [provider]  NIFEdipine (PROCARDIA XL/NIFEDICAL XL) 60 MG 24 hr tablet Take by mouth. 11/19/17   [provider]  pantoprazole (PROTONIX) 40 MG tablet 40 mg daily.  04/03/18   [provider]  timolol (TIMOPTIC) 0.5 % ophthalmic solution  08/27/18   [provider]     Family History  Problem Relation Age of Onset  . Diabetes Mellitus II Mother   . Hypertension Mother   . Diabetes Mellitus II Father   . Diabetes Mellitus  II Brother     Social History   Socioeconomic History  . Marital status: Divorced    Spouse name: Not on file  . Number of children: 0  . Years of education: Not on file  . Highest education level: Not on file  Occupational History  . Occupation: Hostess/waitress    Employer: [employer]  Tobacco Use  . Smoking status: Never Smoker  . Smokeless tobacco: Never Used  Substance and Sexual  Activity  . Alcohol use: Yes    Alcohol/week: 7.0 standard drinks    Types: 7 Glasses of wine per week    Comment: Maybe 1 glass of wine a week  . Drug use: Never  . Sexual activity: Not on file  Other Topics Concern  . Not on file  Social History Narrative   She works for [employer] as a hostess/waitress.   Never smoked.  Enjoys a glass of wine a day.      Does not necessarily get routine exercise because she is tired and work.   Social Determinants of Health   Financial Resource Strain:   . Difficulty of Paying Living Expenses:   Food Insecurity:   . Worried About Running Out of Food in the Last Year:   . Ran Out of Food in the Last Year:   Transportation Needs:   . Lack of Transportation (Medical):   . Lack of Transportation (Non-Medical):   Physical Activity:   . Days of Exercise per Week:   . Minutes of Exercise per Session:   Stress:   . Feeling of Stress :   Social Connections:   . Frequency of Communication with Friends and Family:   . Frequency of Social Gatherings with Friends and Family:   . Attends Religious Services:   . Active Member of Clubs or Organizations:   . Attends Club or Organization Meetings:   . Marital Status:      Review of Systems: A 12 point ROS discussed and pertinent positives are indicated in the HPI above.  All other systems are negative.  Review of Systems  Constitutional: Positive for fatigue. Negative for fever.  Respiratory: Negative for cough and shortness of breath.   Cardiovascular: Negative for chest pain.  Gastrointestinal: Negative for abdominal pain, nausea and vomiting.  Genitourinary: Negative for dysuria.  Musculoskeletal: Negative for back pain.  Psychiatric/Behavioral: Negative for behavioral problems and confusion.    Vital Signs: BP (!) 184/81 Comment: left arm  Pulse 66   Temp 97.9 F (36.6 C) (Oral)   Resp 18   SpO2 100%   Physical Exam Vitals and nursing note reviewed.  Constitutional:       Appearance: Normal appearance.  HENT:     Mouth/Throat:     Mouth: Mucous membranes are moist.     Pharynx: Oropharynx is clear.  Cardiovascular:     Rate and Rhythm: Normal rate and regular rhythm.  Pulmonary:     Effort: Pulmonary effort is normal.     Breath sounds: Normal breath sounds.  Abdominal:     General: Abdomen is flat.     Palpations: Abdomen is soft.  Skin:    General: Skin is warm and dry.  Neurological:     General: No focal deficit present.     Mental Status: She is alert and oriented to person, place, and time. Mental status is at baseline.  Psychiatric:        Mood and Affect: Mood normal.          Behavior: Behavior normal.        Thought Content: Thought content normal.        Judgment: Judgment normal.      MD Evaluation Airway: WNL Heart: WNL Abdomen: WNL Chest/ Lungs: WNL ASA  Classification: 3 Mallampati/Airway Score: One   Imaging: No results found.  Labs:  CBC: Recent Labs    09/21/18 1213 02/02/19 1432 04/12/19 0808 05/03/19 0955  WBC 5.5 6.0 5.9 5.6  HGB 9.3* 8.5* 8.6* 9.1*  HCT 28.2*  28.2* 25.0* 26.5* 27.9*  PLT 347 317 316 343    COAGS: No results for input(s): INR, APTT in the last 8760 hours.  BMP: Recent Labs    09/21/18 1213 04/12/19 0808  NA 139 141  K 4.9 4.6  CL 107 109  CO2 22 21*  GLUCOSE 100* 165*  BUN 53* 50*  CALCIUM 9.5 9.1  CREATININE 1.68* 1.62*  GFRNONAA 31* 33*  GFRAA 36* 38*    LIVER FUNCTION TESTS: Recent Labs    09/21/18 1213 04/12/19 0808  BILITOT 0.4 0.4  AST 13* 11*  ALT 9 12  ALKPHOS 49 46  PROT 7.6 7.0  ALBUMIN 4.0 3.6    TUMOR MARKERS: No results for input(s): AFPTM, CEA, CA199, CHROMGRNA in the last 8760 hours.  Assessment and Plan: Patient with past medical history of DM, HTN, CKD, H pylori infection presents with complaint of persistent, symptomatic anemia.  IR consulted for bone marrow biopsy at the request of Dr. Kale. Case reviewed by Dr. Wagner who approves  patient for procedure.  Patient presents today in their usual state of health.  She has not been NPO and is not currently on blood thinners.  Her pre-procedure HgB today is 9.3. Plan to proceed with pain medication.  Risks and benefits was discussed with the patient and/or patient's family including, but not limited to bleeding, infection, damage to adjacent structures or low yield requiring additional tests.  All of the questions were answered and there is agreement to proceed.  Consent signed and in chart.  Thank you for this interesting consult.  I greatly enjoyed meeting Christine Strong and look forward to participating in their care.  A copy of this report was sent to the requesting provider on this date.  Electronically Signed: Kacie Sue-Ellen Matthews, PA 05/03/2019, 10:34 AM   I spent a total of  30 Minutes   in face to face in clinical consultation, greater than 50% of which was counseling/coordinating care for persistent anemia.   

## 2019-05-05 LAB — SURGICAL PATHOLOGY

## 2019-05-11 ENCOUNTER — Encounter (HOSPITAL_COMMUNITY): Payer: Self-pay | Admitting: Hematology

## 2019-05-11 ENCOUNTER — Telehealth: Payer: Self-pay | Admitting: Hematology

## 2019-05-11 DIAGNOSIS — H3411 Central retinal artery occlusion, right eye: Secondary | ICD-10-CM | POA: Diagnosis not present

## 2019-05-11 DIAGNOSIS — H35033 Hypertensive retinopathy, bilateral: Secondary | ICD-10-CM | POA: Diagnosis not present

## 2019-05-11 DIAGNOSIS — H40052 Ocular hypertension, left eye: Secondary | ICD-10-CM | POA: Diagnosis not present

## 2019-05-11 DIAGNOSIS — Z961 Presence of intraocular lens: Secondary | ICD-10-CM | POA: Diagnosis not present

## 2019-05-11 DIAGNOSIS — E113313 Type 2 diabetes mellitus with moderate nonproliferative diabetic retinopathy with macular edema, bilateral: Secondary | ICD-10-CM | POA: Diagnosis not present

## 2019-05-12 ENCOUNTER — Inpatient Hospital Stay: Payer: Medicare HMO | Attending: Hematology | Admitting: Hematology

## 2019-05-12 DIAGNOSIS — D508 Other iron deficiency anemias: Secondary | ICD-10-CM | POA: Diagnosis not present

## 2019-05-12 DIAGNOSIS — Z79899 Other long term (current) drug therapy: Secondary | ICD-10-CM | POA: Diagnosis not present

## 2019-05-12 DIAGNOSIS — D649 Anemia, unspecified: Secondary | ICD-10-CM | POA: Insufficient documentation

## 2019-05-12 DIAGNOSIS — E785 Hyperlipidemia, unspecified: Secondary | ICD-10-CM | POA: Insufficient documentation

## 2019-05-12 DIAGNOSIS — Z833 Family history of diabetes mellitus: Secondary | ICD-10-CM | POA: Diagnosis not present

## 2019-05-12 DIAGNOSIS — I10 Essential (primary) hypertension: Secondary | ICD-10-CM | POA: Insufficient documentation

## 2019-05-12 DIAGNOSIS — Z8249 Family history of ischemic heart disease and other diseases of the circulatory system: Secondary | ICD-10-CM | POA: Diagnosis not present

## 2019-05-12 DIAGNOSIS — E1142 Type 2 diabetes mellitus with diabetic polyneuropathy: Secondary | ICD-10-CM | POA: Diagnosis not present

## 2019-05-12 DIAGNOSIS — Z7984 Long term (current) use of oral hypoglycemic drugs: Secondary | ICD-10-CM | POA: Insufficient documentation

## 2019-05-12 NOTE — Progress Notes (Signed)
HEMATOLOGY/ONCOLOGY CLINIC NOTE  Date of Service: 05/12/2019  Patient Care Team: Rankins, Bill Salinas, MD as PCP - General (Family Medicine) Leonie Man, MD as PCP - Cardiology (Cardiology)  CHIEF COMPLAINTS/PURPOSE OF CONSULTATION:  Anemia  HISTORY OF PRESENTING ILLNESS:   Christine Strong is a wonderful 69 y.o. female who has been referred to [place] by Dr Alessandra Bevels for evaluation and management of anemia. The pt reports that she is doing well overall.  The pt reports that she went to the Nephrologist near the beginning of the year and that's when he brought up his concern about her anemia. She reports that he thought that her anemia was due to her Eylea shots, which are only intraoccular injections. She has been receiving these since 2017. Dr Alessandra Bevels, her GI, thought that her anemia could have been due to her Helicobacter pylori infection. She completed her antibiotics and denies having a second stool test to confirm the absence of Helicobacter pylori. Her abdominal pain from the infection has resolved.   Pt reports that her Nephrologist thinks that her proteinuria is associated with her diabetes. Pt has been regularly taking her Metformin for her diabetes, which was diagnosed in 2016. She does not have to take insulin and it is currently well controlled. She is already on a Vitamin B12 replacement which she takes intermittently. Pt is unsure if she was actually Vitamin B12 deficient but notes that supplementation did improve her energy. Pt is currently taking a PO iron supplement. She has been on an acid suppressant since the discovery of her Helicobacter pylori infection.  She has noticed some increased fatigue compared to 6 months ago but also notes that she has significanly lowered her intake of caffeine in the last 6 months. She has been walking more lately which has been helping with her energy levels. Pt reports that she has been feeling more depressed due to isolation caused by  imposed social distancing. She is up to date on her age appropriate cancer screenings.   Pt reports that at her heaviest she used to drink 2-3 glasses of wine per day but that she is now down to 4-5 glasses per week.  Of note prior to the patient's visit today, pt has had EGD completed on 03/25/2018 with results revealing "1. Sm Intestine-Duodenum, Bx: SMALL BOWEL MUCOSA WITH NO SIGNIFICANT PATHOLOGIC CHANGES. Negative for features of Celiac disease. Giardia lamblia organisms not present. 2. Stomach - Antrum, Bx: ACTIVE CHRONIC HELICOBACTER PYLORI ASSOCIATED GASTRITIS. POSITIVE for Helicobacter pylori organisms on IP stain. 3. LG Intestine, Random Colon Bx: COLONIC MUCOSA WITH NO SIGNIFICANT PATHOLOGIC CHANGES. Negative for lymphocytic/collagenous colitis, active colitis or dysplasia. 4. LG Intestine- Descending Colon, Rectum, Polyp: TUBULAR ADENOMAS (2)."  Pt has had Colonoscopy completed on 03/25/2018 with results revealing " -Hemorrhoids found on perianal exam. - The examined portion of the ileum was normal. - Normal mucose in the entire examined colon, Biopsied. - One 4 mm polyp in the descending colon, removed with cold snare. Resected and retrieved. - One 8 mm polyp in the rectum, removed with a cold snare. Resected and removed. - Significant colonic spasm."  Most recent lab results (08/18/2018) of CBC w/diff is as follows: WBC at 5.8K, RBC at 2.96, Hgb at 9.0, HCT at 27.1, MCV at 91.5, MCH at 30.5, MCHC at 33.4, RDW at 13.6, Platelets at 476K, MPV at 7.0, Neutro Rel at 65.9, Lymphs Rel at 24.1, Mono Rel at 7.9, Eos Rel at 1.1, Baso Rel at 1.0, Neutro  Abs at 3.8K, Lymphs Abs at 1.40, Mono Abs at 0.5  On review of systems, pt reports fatigue, depression and denies fevers, chills, unexpected night sweats, unexpected weight loss, mouth sores, new bone pains, abdominal pain, back pain, skin rashes and any other symptoms.   On PMHx the pt reports Diabetes, HTN, HLD, central retinal artery occlusion  (right). On Social Hx the pt reports non smoker, 4-5 glasses of wine per week. On Family Hx the pt reports father who passed from lung cancer.  INTERVAL HISTORY:  I connected with  Christine Strong on 05/12/19 by telephone and verified that I am speaking with the correct person using two identifiers.   I discussed the limitations of evaluation and management by telemedicine. The patient expressed understanding and agreed to proceed.  Other persons participating in the visit and their role in the encounter:        -Yevette Edwards, Medical Scribe  Patient's location: Home Provider's location: [place] [facility] [place] is a wonderful 69 y.o. female who is here for evaluation and management of anemia. The patient's last visit with [place] was on 04/12/2019. The pt reports that she is doing well overall.  The pt reports that she has been taking Iron Polysaccharide once per day and reports no abdominal discomfort. She has otherwise been well and has no new concerns.   05/03/2019 BM Bx ([id number]) revealed "BONE MARROW, ASPIRATE, CLOT, CORE: -Hypercellular bone marrow for age with trilineage hematopoiesis. PERIPHERAL BLOOD: -Normocytic-normochromic anemia." 05/03/2019 Flow Pathology Report ([id number]) revealed "- No significant blastic population identified. - No monoclonal B-cell population or abnormal T-cell phenotype identified." 05/03/2019 FISH Analysis revealed No mutations detected.  05/03/2019 Cytogenetics revealed "Normal Female Karyotype".   Lab results (05/03/19) of CBC w/diff is as follows: all values are WNL except for RBC at 2.88, Hgb at 9.1, HCT at 27.9.  On review of systems, pt denies abdominal discomfort and any other symptoms.   MEDICAL HISTORY:  Past Medical History:  Diagnosis Date  . Diabetes mellitus with neuropathy ([place])    On metformin  . Hyperlipidemia    On atorvastatin 20 mg  . Hypertension    On nifedipine 60 mg along with Lopressor 12.5 twice  daily  . Mild aortic stenosis by prior echocardiogram 02/2018   Moderate aortic valve thickening-mild stenosis (mean gradient 9.3 mmHg)    SURGICAL HISTORY: Past Surgical History:  Procedure Laterality Date  . CATARACT EXTRACTION Left 12/19/2017  . TRANSTHORACIC ECHOCARDIOGRAM  03/06/2018   Mildly increased LV thickness.  EF 60-65%.  Now RWMA (read as restrictive filling pattern--on my review this is clearly incorrect, is more grade 1 diastolic dysfunction).  Mild LA dilation.  Moderate mitral valve thickening and calcification but no stenosis or regurgitation.  Moderate aortic valve thickening-mild aortic stenosis.    SOCIAL HISTORY: Social History   Socioeconomic History  . Marital status: Divorced    Spouse name: Not on file  . Number of children: 0  . Years of education: Not on file  . Highest education level: Not on file  Occupational History  . Occupation: Hostess/waitress    Employer: [employer]  Tobacco Use  . Smoking status: Never Smoker  . Smokeless tobacco: Never Used  Substance and Sexual Activity  . Alcohol use: Yes    Alcohol/week: 7.0 standard drinks    Types: 7 Glasses of wine per week    Comment: Maybe 1 glass of wine a week  . Drug use: Never  . Sexual  activity: Not on file  Other Topics Concern  . 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   Never smoked.  Enjoys a glass of wine a day.      Does not necessarily get routine exercise because she is tired and work.   Social Determinants of Health   Financial Resource Strain:   . Difficulty of Paying Living Expenses:   Food Insecurity:   . Worried About Charity fundraiser in the Last Year:   . Arboriculturist in the Last Year:   Transportation Needs:   . Film/video editor (Medical):   Marland Kitchen Lack of Transportation (Non-Medical):   Physical Activity:   . Days of Exercise per Week:   . Minutes of Exercise per Session:   Stress:   . Feeling  of Stress :   Social Connections:   . Frequency of Communication with Friends and Family:   . Frequency of Social Gatherings with Friends and Family:   . Attends Religious Services:   . Active Member of Clubs or Organizations:   . Attends Archivist Meetings:   Marland Kitchen Marital Status:   Intimate Partner Violence:   . Fear of Current or Ex-Partner:   . Emotionally Abused:   Marland Kitchen Physically Abused:   . Sexually Abused:     FAMILY HISTORY: Family History  Problem Relation Age of Onset  . Diabetes Mellitus II Mother   . Hypertension Mother   . Diabetes Mellitus II Father   . Diabetes Mellitus II Brother     ALLERGIES:  has No Known Allergies.  MEDICATIONS:  Current Outpatient Medications  Medication Sig Dispense Refill  . atorvastatin (LIPITOR) 80 MG tablet Take by mouth.    . brimonidine (ALPHAGAN) 0.2 % ophthalmic solution     . Cholecalciferol (VITAMIN D-1000 MAX ST) 25 MCG (1000 UT) tablet Take by mouth.    . Cyanocobalamin (VITAMIN B 12 PO) Take 2 tablets by mouth daily.    . dorzolamide (TRUSOPT) 2 % ophthalmic solution     . ergocalciferol (VITAMIN D2) 1.25 MG (50000 UT) capsule Take by mouth.    . iron polysaccharides (NIFEREX) 150 MG capsule Take 1 capsule (150 mg total) by mouth daily. 60 capsule 3  . lisinopril (ZESTRIL) 40 MG tablet     . Melatonin 3 MG TABS 3 mg nightly.    . metformin (FORTAMET) 1000 MG (OSM) 24 hr tablet Take 1 tablet (1,000 mg total) by mouth 2 (two) times daily with a meal. 60 tablet 0  . metoprolol tartrate (LOPRESSOR) 25 MG tablet TAKE ONE TABLET BY MOUTH TWICE A DAY (REPLACES LABETALOL)    . NIFEdipine (PROCARDIA XL/NIFEDICAL XL) 60 MG 24 hr tablet Take by mouth.    . pantoprazole (PROTONIX) 40 MG tablet 40 mg daily.     . timolol (TIMOPTIC) 0.5 % ophthalmic solution      No current facility-administered medications for this visit.    REVIEW OF SYSTEMS:   A 10+ POINT REVIEW OF SYSTEMS WAS OBTAINED including neurology, dermatology,  psychiatry, cardiac, respiratory, lymph, extremities, GI, GU, Musculoskeletal, constitutional, breasts, reproductive, HEENT.  All pertinent positives are noted in the HPI.  All others are negative.   PHYSICAL EXAMINATION: ECOG PERFORMANCE STATUS: 1 - Symptomatic but completely ambulatory  . There were no vitals filed for this visit. There were no vitals filed for this visit. .There is no height or weight on file to calculate BMI.  Telehealth visit  LABORATORY DATA:  I have reviewed the data as listed  . CBC Latest Ref Rng & Units 05/03/2019 04/12/2019 02/02/2019  WBC 4.0 - 10.5 K/uL 5.6 5.9 6.0  Hemoglobin 12.0 - 15.0 g/dL 9.1(L) 8.6(L) 8.5(L)  Hematocrit 36.0 - 46.0 % 27.9(L) 26.5(L) 25.0(L)  Platelets 150 - 400 K/uL 343 316 317    . CBC Latest Ref Rng & Units 05/03/2019 04/12/2019 02/02/2019  WBC 4.0 - 10.5 K/uL 5.6 5.9 6.0  Hemoglobin 12.0 - 15.0 g/dL 9.1(L) 8.6(L) 8.5(L)  Hematocrit 36.0 - 46.0 % 27.9(L) 26.5(L) 25.0(L)  Platelets 150 - 400 K/uL 343 316 317   .CBC    Component Value Date/Time   WBC 5.6 05/03/2019 0955   RBC 2.88 (L) 05/03/2019 0955   HGB 9.1 (L) 05/03/2019 0955   HGB 8.6 (L) 04/12/2019 0808   HCT 27.9 (L) 05/03/2019 0955   HCT 28.2 (L) 09/21/2018 1213   PLT 343 05/03/2019 0955   PLT 316 04/12/2019 0808   MCV 96.9 05/03/2019 0955   MCH 31.6 05/03/2019 0955   MCHC 32.6 05/03/2019 0955   RDW 13.3 05/03/2019 0955   LYMPHSABS 1.5 05/03/2019 0955   MONOABS 0.5 05/03/2019 0955   EOSABS 0.1 05/03/2019 0955   BASOSABS 0.0 05/03/2019 0955    . CMP Latest Ref Rng & Units 04/12/2019 09/21/2018 12/05/2013  Glucose 70 - 99 mg/dL 165(H) 100(H) 117(H)  BUN 8 - 23 mg/dL 50(H) 53(H) 21  Creatinine 0.44 - 1.00 mg/dL 1.62(H) 1.68(H) 0.57  Sodium 135 - 145 mmol/L 141 139 140  Potassium 3.5 - 5.1 mmol/L 4.6 4.9 4.9  Chloride 98 - 111 mmol/L 109 107 102  CO2 22 - 32 mmol/L 21(L) 22 24  Calcium 8.9 - 10.3 mg/dL 9.1 9.5 9.7  Total Protein 6.5 - 8.1 g/dL 7.0 7.6 -    Total Bilirubin 0.3 - 1.2 mg/dL 0.4 0.4 -  Alkaline Phos 38 - 126 U/L 46 49 -  AST 15 - 41 U/L 11(L) 13(L) -  ALT 0 - 44 U/L 12 9 -   . Lab Results  Component Value Date   IRON 84 04/12/2019   TIBC 244 04/12/2019   IRONPCTSAT 34 04/12/2019   (Iron and TIBC)  Lab Results  Component Value Date   FERRITIN 194 04/12/2019   05/03/2019 Cytogenetics:    05/03/2019 FISH Analysis:    05/03/2019 Flow Pathology Report:    05/03/2019 BM Bx:   03/25/2018 Colonoscopy    03/25/2018 EDG    RADIOGRAPHIC STUDIES: I have personally reviewed the radiological images as listed and agreed with the findings in the report. CT Biopsy  Result Date: 05/03/2019 INDICATION: 69 year old female with anemia referred for bone marrow biopsy EXAM: CT BIOPSY; CT BONE MARROW BIOPSY AND ASPIRATION MEDICATIONS: None. ANESTHESIA/SEDATION: Moderate (conscious) sedation was employed during this procedure. A total of Versed 0 mg and Fentanyl 50 mcg was administered intravenously. Moderate Sedation Time: 0 minutes. The patient's level of consciousness and vital signs were monitored continuously by radiology nursing throughout the procedure under my direct supervision. FLUOROSCOPY TIME:  CT COMPLICATIONS: None PROCEDURE: The procedure risks, benefits, and alternatives were explained to the patient. Questions regarding the procedure were encouraged and answered. The patient understands and consents to the procedure. Scout CT of the pelvis was performed for surgical planning purposes. The left posterior pelvis was prepped with Chlorhexidine in a sterile fashion, and a sterile drape was applied covering the operative field. A sterile gown and sterile gloves were used for  the procedure. Local anesthesia was provided with 1% Lidocaine. Posterior left iliac bone was targeted for biopsy. The skin and subcutaneous tissues were infiltrated with 1% lidocaine without epinephrine. A small stab incision was made with an 11 blade  scalpel, and an 11 gauge Murphy needle was advanced with CT guidance to the posterior cortex. Manual forced was used to advance the needle through the posterior cortex and the stylet was removed. A bone marrow aspirate was retrieved and passed to a cytotechnologist in the room. The Murphy needle was then advanced without the stylet for a core biopsy. The core biopsy was retrieved and also passed to a cytotechnologist. Manual pressure was used for hemostasis and a sterile dressing was placed. No complications were encountered no significant blood loss was encountered. Patient tolerated the procedure well and remained hemodynamically stable throughout. IMPRESSION: Status post CT-guided bone marrow biopsy, with tissue specimen sent to pathology for complete histopathologic analysis Signed, Dulcy Fanny. Earleen Newport, DO [facility] Radiology Electronically Signed   By: Corrie Mckusick D.O.   On: 05/03/2019 12:37   CT BONE MARROW BIOPSY & ASPIRATION  Result Date: 05/03/2019 INDICATION: 69 year old female with anemia referred for bone marrow biopsy EXAM: CT BIOPSY; CT BONE MARROW BIOPSY AND ASPIRATION MEDICATIONS: None. ANESTHESIA/SEDATION: Moderate (conscious) sedation was employed during this procedure. A total of Versed 0 mg and Fentanyl 50 mcg was administered intravenously. Moderate Sedation Time: 0 minutes. The patient's level of consciousness and vital signs were monitored continuously by radiology nursing throughout the procedure under my direct supervision. FLUOROSCOPY TIME:  CT COMPLICATIONS: None PROCEDURE: The procedure risks, benefits, and alternatives were explained to the patient. Questions regarding the procedure were encouraged and answered. The patient understands and consents to the procedure. Scout CT of the pelvis was performed for surgical planning purposes. The left posterior pelvis was prepped with Chlorhexidine in a sterile fashion, and a sterile drape was  applied covering the operative field. A sterile gown and sterile gloves were used for the procedure. Local anesthesia was provided with 1% Lidocaine. Posterior left iliac bone was targeted for biopsy. The skin and subcutaneous tissues were infiltrated with 1% lidocaine without epinephrine. A small stab incision was made with an 11 blade scalpel, and an 11 gauge Murphy needle was advanced with CT guidance to the posterior cortex. Manual forced was used to advance the needle through the posterior cortex and the stylet was removed. A bone marrow aspirate was retrieved and passed to a cytotechnologist in the room. The Murphy needle was then advanced without the stylet for a core biopsy. The core biopsy was retrieved and also passed to a cytotechnologist. Manual pressure was used for hemostasis and a sterile dressing was placed. No complications were encountered no significant blood loss was encountered. Patient tolerated the procedure well and remained hemodynamically stable throughout. IMPRESSION: Status post CT-guided bone marrow biopsy, with tissue specimen sent to pathology for complete histopathologic analysis Signed, Dulcy Fanny. Earleen Newport, DO [facility] Radiology Electronically Signed   By: Corrie Mckusick D.O.   On: 05/03/2019 12:37    ASSESSMENT & PLAN:   1) Normocytic Anemia   03/25/2018 EGD which revealed "1. Sm Intestine-Duodenum, Bx: SMALL BOWEL MUCOSA WITH NO SIGNIFICANT PATHOLOGIC CHANGES. Negative for features of Celiac disease. Giardia lamblia organisms not present. 2. Stomach - Antrum, Bx: ACTIVE CHRONIC HELICOBACTER PYLORI ASSOCIATED GASTRITIS. POSITIVE for Helicobacter pylori organisms on IP stain. 3. LG Intestine, Random Colon Bx: COLONIC MUCOSA WITH NO SIGNIFICANT PATHOLOGIC  CHANGES. Negative for lymphocytic/collagenous colitis, active colitis or dysplasia. 4. LG Intestine- Descending Colon, Rectum, Polyp: TUBULAR ADENOMAS (2)."  03/25/2018  Colonoscopy which revealed " -Hemorrhoids found on perianal exam. - The examined portion of the ileum was normal. - Normal mucose in the entire examined colon, Biopsied. - One 4 mm polyp in the descending colon, removed with cold snare. Resected and retrieved. - One 8 mm polyp in the rectum, removed with a cold snare. Resected and removed. - Significant colonic spasm."  PLAN: -Discussed pt labwork, 05/03/2019; all values are WNL except for RBC at 2.88, Hgb at 9.1, HCT at 27.9. - Hgb has improved.   -Discussed 05/03/2019 BM Bx ([id number]) revealed "BONE MARROW, ASPIRATE, CLOT, CORE: -Hypercellular bone marrow for age with trilineage hematopoiesis. PERIPHERAL BLOOD: -Normocytic-normochromic anemia." -Discussed 05/03/2019 Flow Pathology Report ([id number]) revealed "- No significant blastic population identified. - No monoclonal B-cell population or abnormal T-cell phenotype identified." -Discussed 05/03/2019 FISH Analysis revealed No mutations detected.  -Discussed 05/03/2019 Cytogenetics revealed "Normal Female Karyotype".  -Advised pt that we would like to keep Ferritin >250 (in the setting of CKD) to keep Hgb >9  -Advised pt that if Hgb <9 when Iron is well-replaced will consider using ESAs  -Recommend pt take 150 mg PO Iron Polysaccharide BID  -Will see back in 3 months with labs  -Advised pt to contact if dramatic change in energy levels   FOLLOW UP: RTC with Dr Irene Limbo with labs in 3 months   The total time spent in the appt was 20  minutes and more than 50% was on counseling and direct patient cares.  All of the patient's questions were answered with apparent satisfaction. The patient knows to call the clinic with any problems, questions or concerns.    Sullivan Lone MD [place] AAHIVMS [facility] Hematology/Oncology Physician [facility]  (Office):       [phone] (Work cell):  [phone] (Fax):           [phone]  05/12/2019 10:10 AM  I, Yevette Edwards, am  acting as a scribe for Dr. Sullivan Lone.   .I have reviewed the above documentation for accuracy and completeness, and I agree with the above. Brunetta Genera MD

## 2019-05-14 ENCOUNTER — Telehealth: Payer: Self-pay | Admitting: Hematology

## 2019-05-14 NOTE — Telephone Encounter (Signed)
Scheduled per 05/05 los, patient has been called and voicemail was left.

## 2019-06-21 DIAGNOSIS — I129 Hypertensive chronic kidney disease with stage 1 through stage 4 chronic kidney disease, or unspecified chronic kidney disease: Secondary | ICD-10-CM | POA: Diagnosis not present

## 2019-06-21 DIAGNOSIS — N183 Chronic kidney disease, stage 3 unspecified: Secondary | ICD-10-CM | POA: Diagnosis not present

## 2019-06-21 DIAGNOSIS — E1122 Type 2 diabetes mellitus with diabetic chronic kidney disease: Secondary | ICD-10-CM | POA: Diagnosis not present

## 2019-06-22 DIAGNOSIS — H3411 Central retinal artery occlusion, right eye: Secondary | ICD-10-CM | POA: Diagnosis not present

## 2019-06-22 DIAGNOSIS — H40052 Ocular hypertension, left eye: Secondary | ICD-10-CM | POA: Diagnosis not present

## 2019-06-22 DIAGNOSIS — H35033 Hypertensive retinopathy, bilateral: Secondary | ICD-10-CM | POA: Diagnosis not present

## 2019-06-22 DIAGNOSIS — Z961 Presence of intraocular lens: Secondary | ICD-10-CM | POA: Diagnosis not present

## 2019-06-22 DIAGNOSIS — E113313 Type 2 diabetes mellitus with moderate nonproliferative diabetic retinopathy with macular edema, bilateral: Secondary | ICD-10-CM | POA: Diagnosis not present

## 2019-06-24 DIAGNOSIS — D631 Anemia in chronic kidney disease: Secondary | ICD-10-CM | POA: Diagnosis not present

## 2019-06-24 DIAGNOSIS — N179 Acute kidney failure, unspecified: Secondary | ICD-10-CM | POA: Diagnosis not present

## 2019-06-24 DIAGNOSIS — I129 Hypertensive chronic kidney disease with stage 1 through stage 4 chronic kidney disease, or unspecified chronic kidney disease: Secondary | ICD-10-CM | POA: Diagnosis not present

## 2019-06-24 DIAGNOSIS — N183 Chronic kidney disease, stage 3 unspecified: Secondary | ICD-10-CM | POA: Diagnosis not present

## 2019-06-24 DIAGNOSIS — E1122 Type 2 diabetes mellitus with diabetic chronic kidney disease: Secondary | ICD-10-CM | POA: Diagnosis not present

## 2019-06-24 DIAGNOSIS — E889 Metabolic disorder, unspecified: Secondary | ICD-10-CM | POA: Diagnosis not present

## 2019-06-24 DIAGNOSIS — M9080 Osteopathy in diseases classified elsewhere, unspecified site: Secondary | ICD-10-CM | POA: Diagnosis not present

## 2019-06-24 DIAGNOSIS — E559 Vitamin D deficiency, unspecified: Secondary | ICD-10-CM | POA: Diagnosis not present

## 2019-06-24 DIAGNOSIS — R801 Persistent proteinuria, unspecified: Secondary | ICD-10-CM | POA: Diagnosis not present

## 2019-06-24 DIAGNOSIS — N1832 Chronic kidney disease, stage 3b: Secondary | ICD-10-CM | POA: Diagnosis not present

## 2019-08-03 DIAGNOSIS — E113313 Type 2 diabetes mellitus with moderate nonproliferative diabetic retinopathy with macular edema, bilateral: Secondary | ICD-10-CM | POA: Diagnosis not present

## 2019-08-03 DIAGNOSIS — H3411 Central retinal artery occlusion, right eye: Secondary | ICD-10-CM | POA: Diagnosis not present

## 2019-08-03 DIAGNOSIS — H40052 Ocular hypertension, left eye: Secondary | ICD-10-CM | POA: Diagnosis not present

## 2019-08-03 DIAGNOSIS — Z961 Presence of intraocular lens: Secondary | ICD-10-CM | POA: Diagnosis not present

## 2019-08-03 DIAGNOSIS — H35033 Hypertensive retinopathy, bilateral: Secondary | ICD-10-CM | POA: Diagnosis not present

## 2019-08-06 DIAGNOSIS — N182 Chronic kidney disease, stage 2 (mild): Secondary | ICD-10-CM | POA: Diagnosis not present

## 2019-08-06 DIAGNOSIS — D649 Anemia, unspecified: Secondary | ICD-10-CM | POA: Diagnosis not present

## 2019-08-06 DIAGNOSIS — E1121 Type 2 diabetes mellitus with diabetic nephropathy: Secondary | ICD-10-CM | POA: Diagnosis not present

## 2019-08-06 DIAGNOSIS — E11311 Type 2 diabetes mellitus with unspecified diabetic retinopathy with macular edema: Secondary | ICD-10-CM | POA: Diagnosis not present

## 2019-08-06 DIAGNOSIS — E7800 Pure hypercholesterolemia, unspecified: Secondary | ICD-10-CM | POA: Diagnosis not present

## 2019-08-06 DIAGNOSIS — E785 Hyperlipidemia, unspecified: Secondary | ICD-10-CM | POA: Diagnosis not present

## 2019-08-06 DIAGNOSIS — E1149 Type 2 diabetes mellitus with other diabetic neurological complication: Secondary | ICD-10-CM | POA: Diagnosis not present

## 2019-08-06 DIAGNOSIS — I10 Essential (primary) hypertension: Secondary | ICD-10-CM | POA: Diagnosis not present

## 2019-08-06 DIAGNOSIS — E1165 Type 2 diabetes mellitus with hyperglycemia: Secondary | ICD-10-CM | POA: Diagnosis not present

## 2019-08-12 ENCOUNTER — Inpatient Hospital Stay: Payer: Medicare HMO | Admitting: Hematology

## 2019-08-12 ENCOUNTER — Inpatient Hospital Stay: Payer: Medicare HMO | Attending: Hematology

## 2019-08-17 ENCOUNTER — Other Ambulatory Visit: Payer: Self-pay | Admitting: Hematology

## 2019-08-18 ENCOUNTER — Telehealth: Payer: Self-pay | Admitting: Hematology

## 2019-08-18 NOTE — Telephone Encounter (Signed)
Called pt per 8/10 sch msg  - pt missed appt on 8/5 - left message for patient to call back to reschedule appt.

## 2019-09-07 DIAGNOSIS — E1121 Type 2 diabetes mellitus with diabetic nephropathy: Secondary | ICD-10-CM | POA: Diagnosis not present

## 2019-09-07 DIAGNOSIS — E7800 Pure hypercholesterolemia, unspecified: Secondary | ICD-10-CM | POA: Diagnosis not present

## 2019-09-07 DIAGNOSIS — I10 Essential (primary) hypertension: Secondary | ICD-10-CM | POA: Diagnosis not present

## 2019-09-07 DIAGNOSIS — D649 Anemia, unspecified: Secondary | ICD-10-CM | POA: Diagnosis not present

## 2019-09-07 DIAGNOSIS — N1832 Chronic kidney disease, stage 3b: Secondary | ICD-10-CM | POA: Diagnosis not present

## 2019-09-14 DIAGNOSIS — E113313 Type 2 diabetes mellitus with moderate nonproliferative diabetic retinopathy with macular edema, bilateral: Secondary | ICD-10-CM | POA: Diagnosis not present

## 2019-09-14 DIAGNOSIS — Z961 Presence of intraocular lens: Secondary | ICD-10-CM | POA: Diagnosis not present

## 2019-09-14 DIAGNOSIS — H35033 Hypertensive retinopathy, bilateral: Secondary | ICD-10-CM | POA: Diagnosis not present

## 2019-09-14 DIAGNOSIS — H40052 Ocular hypertension, left eye: Secondary | ICD-10-CM | POA: Diagnosis not present

## 2019-09-14 DIAGNOSIS — H3411 Central retinal artery occlusion, right eye: Secondary | ICD-10-CM | POA: Diagnosis not present

## 2019-09-17 DIAGNOSIS — N183 Chronic kidney disease, stage 3 unspecified: Secondary | ICD-10-CM | POA: Diagnosis not present

## 2019-09-17 DIAGNOSIS — R801 Persistent proteinuria, unspecified: Secondary | ICD-10-CM | POA: Diagnosis not present

## 2019-09-17 DIAGNOSIS — I129 Hypertensive chronic kidney disease with stage 1 through stage 4 chronic kidney disease, or unspecified chronic kidney disease: Secondary | ICD-10-CM | POA: Diagnosis not present

## 2019-09-17 DIAGNOSIS — N179 Acute kidney failure, unspecified: Secondary | ICD-10-CM | POA: Diagnosis not present

## 2019-09-20 DIAGNOSIS — E1122 Type 2 diabetes mellitus with diabetic chronic kidney disease: Secondary | ICD-10-CM | POA: Diagnosis not present

## 2019-09-20 DIAGNOSIS — M9080 Osteopathy in diseases classified elsewhere, unspecified site: Secondary | ICD-10-CM | POA: Diagnosis not present

## 2019-09-20 DIAGNOSIS — E559 Vitamin D deficiency, unspecified: Secondary | ICD-10-CM | POA: Diagnosis not present

## 2019-09-20 DIAGNOSIS — E889 Metabolic disorder, unspecified: Secondary | ICD-10-CM | POA: Diagnosis not present

## 2019-09-20 DIAGNOSIS — N183 Chronic kidney disease, stage 3 unspecified: Secondary | ICD-10-CM | POA: Diagnosis not present

## 2019-09-20 DIAGNOSIS — I129 Hypertensive chronic kidney disease with stage 1 through stage 4 chronic kidney disease, or unspecified chronic kidney disease: Secondary | ICD-10-CM | POA: Diagnosis not present

## 2019-09-20 DIAGNOSIS — D631 Anemia in chronic kidney disease: Secondary | ICD-10-CM | POA: Diagnosis not present

## 2019-09-20 DIAGNOSIS — N179 Acute kidney failure, unspecified: Secondary | ICD-10-CM | POA: Diagnosis not present

## 2019-09-20 DIAGNOSIS — R801 Persistent proteinuria, unspecified: Secondary | ICD-10-CM | POA: Diagnosis not present

## 2019-09-20 DIAGNOSIS — N1832 Chronic kidney disease, stage 3b: Secondary | ICD-10-CM | POA: Diagnosis not present

## 2019-10-04 DIAGNOSIS — E1122 Type 2 diabetes mellitus with diabetic chronic kidney disease: Secondary | ICD-10-CM | POA: Diagnosis not present

## 2019-10-04 DIAGNOSIS — N183 Chronic kidney disease, stage 3 unspecified: Secondary | ICD-10-CM | POA: Diagnosis not present

## 2019-10-21 DIAGNOSIS — D649 Anemia, unspecified: Secondary | ICD-10-CM | POA: Diagnosis not present

## 2019-10-21 DIAGNOSIS — E1121 Type 2 diabetes mellitus with diabetic nephropathy: Secondary | ICD-10-CM | POA: Diagnosis not present

## 2019-10-21 DIAGNOSIS — E11311 Type 2 diabetes mellitus with unspecified diabetic retinopathy with macular edema: Secondary | ICD-10-CM | POA: Diagnosis not present

## 2019-10-21 DIAGNOSIS — N1832 Chronic kidney disease, stage 3b: Secondary | ICD-10-CM | POA: Diagnosis not present

## 2019-10-21 DIAGNOSIS — E1149 Type 2 diabetes mellitus with other diabetic neurological complication: Secondary | ICD-10-CM | POA: Diagnosis not present

## 2019-10-21 DIAGNOSIS — E7800 Pure hypercholesterolemia, unspecified: Secondary | ICD-10-CM | POA: Diagnosis not present

## 2019-10-21 DIAGNOSIS — I10 Essential (primary) hypertension: Secondary | ICD-10-CM | POA: Diagnosis not present

## 2019-10-21 DIAGNOSIS — E785 Hyperlipidemia, unspecified: Secondary | ICD-10-CM | POA: Diagnosis not present

## 2019-10-26 DIAGNOSIS — H40052 Ocular hypertension, left eye: Secondary | ICD-10-CM | POA: Diagnosis not present

## 2019-10-26 DIAGNOSIS — H3411 Central retinal artery occlusion, right eye: Secondary | ICD-10-CM | POA: Diagnosis not present

## 2019-10-26 DIAGNOSIS — E113313 Type 2 diabetes mellitus with moderate nonproliferative diabetic retinopathy with macular edema, bilateral: Secondary | ICD-10-CM | POA: Diagnosis not present

## 2019-10-26 DIAGNOSIS — H35033 Hypertensive retinopathy, bilateral: Secondary | ICD-10-CM | POA: Diagnosis not present

## 2019-10-26 DIAGNOSIS — I10 Essential (primary) hypertension: Secondary | ICD-10-CM | POA: Diagnosis not present

## 2019-10-26 DIAGNOSIS — Z961 Presence of intraocular lens: Secondary | ICD-10-CM | POA: Diagnosis not present

## 2019-10-27 DIAGNOSIS — H9311 Tinnitus, right ear: Secondary | ICD-10-CM | POA: Diagnosis not present

## 2019-11-20 ENCOUNTER — Other Ambulatory Visit: Payer: Self-pay | Admitting: Hematology

## 2019-11-30 DIAGNOSIS — E1149 Type 2 diabetes mellitus with other diabetic neurological complication: Secondary | ICD-10-CM | POA: Diagnosis not present

## 2019-11-30 DIAGNOSIS — I10 Essential (primary) hypertension: Secondary | ICD-10-CM | POA: Diagnosis not present

## 2019-11-30 DIAGNOSIS — D649 Anemia, unspecified: Secondary | ICD-10-CM | POA: Diagnosis not present

## 2019-11-30 DIAGNOSIS — E7800 Pure hypercholesterolemia, unspecified: Secondary | ICD-10-CM | POA: Diagnosis not present

## 2019-11-30 DIAGNOSIS — E785 Hyperlipidemia, unspecified: Secondary | ICD-10-CM | POA: Diagnosis not present

## 2019-11-30 DIAGNOSIS — E11311 Type 2 diabetes mellitus with unspecified diabetic retinopathy with macular edema: Secondary | ICD-10-CM | POA: Diagnosis not present

## 2019-11-30 DIAGNOSIS — N1832 Chronic kidney disease, stage 3b: Secondary | ICD-10-CM | POA: Diagnosis not present

## 2019-11-30 DIAGNOSIS — E1121 Type 2 diabetes mellitus with diabetic nephropathy: Secondary | ICD-10-CM | POA: Diagnosis not present

## 2019-12-07 DIAGNOSIS — E1165 Type 2 diabetes mellitus with hyperglycemia: Secondary | ICD-10-CM | POA: Diagnosis not present

## 2019-12-07 DIAGNOSIS — Z961 Presence of intraocular lens: Secondary | ICD-10-CM | POA: Diagnosis not present

## 2019-12-07 DIAGNOSIS — H35033 Hypertensive retinopathy, bilateral: Secondary | ICD-10-CM | POA: Diagnosis not present

## 2019-12-07 DIAGNOSIS — H3411 Central retinal artery occlusion, right eye: Secondary | ICD-10-CM | POA: Diagnosis not present

## 2019-12-07 DIAGNOSIS — H40052 Ocular hypertension, left eye: Secondary | ICD-10-CM | POA: Diagnosis not present

## 2019-12-07 DIAGNOSIS — E113313 Type 2 diabetes mellitus with moderate nonproliferative diabetic retinopathy with macular edema, bilateral: Secondary | ICD-10-CM | POA: Diagnosis not present

## 2019-12-20 DIAGNOSIS — H903 Sensorineural hearing loss, bilateral: Secondary | ICD-10-CM | POA: Diagnosis not present

## 2019-12-20 DIAGNOSIS — H9313 Tinnitus, bilateral: Secondary | ICD-10-CM | POA: Diagnosis not present

## 2019-12-22 DIAGNOSIS — E7800 Pure hypercholesterolemia, unspecified: Secondary | ICD-10-CM | POA: Diagnosis not present

## 2019-12-22 DIAGNOSIS — E11311 Type 2 diabetes mellitus with unspecified diabetic retinopathy with macular edema: Secondary | ICD-10-CM | POA: Diagnosis not present

## 2019-12-22 DIAGNOSIS — E1121 Type 2 diabetes mellitus with diabetic nephropathy: Secondary | ICD-10-CM | POA: Diagnosis not present

## 2019-12-22 DIAGNOSIS — E1149 Type 2 diabetes mellitus with other diabetic neurological complication: Secondary | ICD-10-CM | POA: Diagnosis not present

## 2019-12-22 DIAGNOSIS — I10 Essential (primary) hypertension: Secondary | ICD-10-CM | POA: Diagnosis not present

## 2019-12-22 DIAGNOSIS — E785 Hyperlipidemia, unspecified: Secondary | ICD-10-CM | POA: Diagnosis not present

## 2019-12-22 DIAGNOSIS — D649 Anemia, unspecified: Secondary | ICD-10-CM | POA: Diagnosis not present

## 2019-12-22 DIAGNOSIS — N1832 Chronic kidney disease, stage 3b: Secondary | ICD-10-CM | POA: Diagnosis not present

## 2020-01-18 DIAGNOSIS — Z961 Presence of intraocular lens: Secondary | ICD-10-CM | POA: Diagnosis not present

## 2020-01-18 DIAGNOSIS — I10 Essential (primary) hypertension: Secondary | ICD-10-CM | POA: Diagnosis not present

## 2020-01-18 DIAGNOSIS — E113313 Type 2 diabetes mellitus with moderate nonproliferative diabetic retinopathy with macular edema, bilateral: Secondary | ICD-10-CM | POA: Diagnosis not present

## 2020-01-18 DIAGNOSIS — H3411 Central retinal artery occlusion, right eye: Secondary | ICD-10-CM | POA: Diagnosis not present

## 2020-01-18 DIAGNOSIS — H40052 Ocular hypertension, left eye: Secondary | ICD-10-CM | POA: Diagnosis not present

## 2020-01-18 DIAGNOSIS — H35033 Hypertensive retinopathy, bilateral: Secondary | ICD-10-CM | POA: Diagnosis not present

## 2020-02-04 DIAGNOSIS — N183 Chronic kidney disease, stage 3 unspecified: Secondary | ICD-10-CM | POA: Diagnosis not present

## 2020-02-04 DIAGNOSIS — E1122 Type 2 diabetes mellitus with diabetic chronic kidney disease: Secondary | ICD-10-CM | POA: Diagnosis not present

## 2020-02-08 DIAGNOSIS — I129 Hypertensive chronic kidney disease with stage 1 through stage 4 chronic kidney disease, or unspecified chronic kidney disease: Secondary | ICD-10-CM | POA: Diagnosis not present

## 2020-02-08 DIAGNOSIS — M9080 Osteopathy in diseases classified elsewhere, unspecified site: Secondary | ICD-10-CM | POA: Diagnosis not present

## 2020-02-08 DIAGNOSIS — N1832 Chronic kidney disease, stage 3b: Secondary | ICD-10-CM | POA: Diagnosis not present

## 2020-02-08 DIAGNOSIS — E889 Metabolic disorder, unspecified: Secondary | ICD-10-CM | POA: Diagnosis not present

## 2020-02-08 DIAGNOSIS — E1122 Type 2 diabetes mellitus with diabetic chronic kidney disease: Secondary | ICD-10-CM | POA: Diagnosis not present

## 2020-02-08 DIAGNOSIS — R801 Persistent proteinuria, unspecified: Secondary | ICD-10-CM | POA: Diagnosis not present

## 2020-02-08 DIAGNOSIS — D631 Anemia in chronic kidney disease: Secondary | ICD-10-CM | POA: Diagnosis not present

## 2020-02-08 DIAGNOSIS — N184 Chronic kidney disease, stage 4 (severe): Secondary | ICD-10-CM | POA: Diagnosis not present

## 2020-02-29 DIAGNOSIS — Z961 Presence of intraocular lens: Secondary | ICD-10-CM | POA: Diagnosis not present

## 2020-02-29 DIAGNOSIS — H3411 Central retinal artery occlusion, right eye: Secondary | ICD-10-CM | POA: Diagnosis not present

## 2020-02-29 DIAGNOSIS — H40052 Ocular hypertension, left eye: Secondary | ICD-10-CM | POA: Diagnosis not present

## 2020-02-29 DIAGNOSIS — E113313 Type 2 diabetes mellitus with moderate nonproliferative diabetic retinopathy with macular edema, bilateral: Secondary | ICD-10-CM | POA: Diagnosis not present

## 2020-02-29 DIAGNOSIS — H35033 Hypertensive retinopathy, bilateral: Secondary | ICD-10-CM | POA: Diagnosis not present

## 2020-03-08 DIAGNOSIS — I10 Essential (primary) hypertension: Secondary | ICD-10-CM | POA: Diagnosis not present

## 2020-03-08 DIAGNOSIS — E7800 Pure hypercholesterolemia, unspecified: Secondary | ICD-10-CM | POA: Diagnosis not present

## 2020-03-08 DIAGNOSIS — R809 Proteinuria, unspecified: Secondary | ICD-10-CM | POA: Diagnosis not present

## 2020-03-08 DIAGNOSIS — D649 Anemia, unspecified: Secondary | ICD-10-CM | POA: Diagnosis not present

## 2020-03-08 DIAGNOSIS — N184 Chronic kidney disease, stage 4 (severe): Secondary | ICD-10-CM | POA: Diagnosis not present

## 2020-03-08 DIAGNOSIS — E1121 Type 2 diabetes mellitus with diabetic nephropathy: Secondary | ICD-10-CM | POA: Diagnosis not present

## 2020-04-11 DIAGNOSIS — Z961 Presence of intraocular lens: Secondary | ICD-10-CM | POA: Diagnosis not present

## 2020-04-11 DIAGNOSIS — H35033 Hypertensive retinopathy, bilateral: Secondary | ICD-10-CM | POA: Diagnosis not present

## 2020-04-11 DIAGNOSIS — H40052 Ocular hypertension, left eye: Secondary | ICD-10-CM | POA: Diagnosis not present

## 2020-04-11 DIAGNOSIS — I10 Essential (primary) hypertension: Secondary | ICD-10-CM | POA: Diagnosis not present

## 2020-04-11 DIAGNOSIS — H3411 Central retinal artery occlusion, right eye: Secondary | ICD-10-CM | POA: Diagnosis not present

## 2020-04-11 DIAGNOSIS — E113313 Type 2 diabetes mellitus with moderate nonproliferative diabetic retinopathy with macular edema, bilateral: Secondary | ICD-10-CM | POA: Diagnosis not present

## 2020-04-11 DIAGNOSIS — Z7984 Long term (current) use of oral hypoglycemic drugs: Secondary | ICD-10-CM | POA: Diagnosis not present

## 2020-04-19 DIAGNOSIS — I10 Essential (primary) hypertension: Secondary | ICD-10-CM | POA: Diagnosis not present

## 2020-04-19 DIAGNOSIS — R809 Proteinuria, unspecified: Secondary | ICD-10-CM | POA: Diagnosis not present

## 2020-04-19 DIAGNOSIS — N184 Chronic kidney disease, stage 4 (severe): Secondary | ICD-10-CM | POA: Diagnosis not present

## 2020-04-19 DIAGNOSIS — Z7984 Long term (current) use of oral hypoglycemic drugs: Secondary | ICD-10-CM | POA: Diagnosis not present

## 2020-04-19 DIAGNOSIS — D649 Anemia, unspecified: Secondary | ICD-10-CM | POA: Diagnosis not present

## 2020-04-19 DIAGNOSIS — E1121 Type 2 diabetes mellitus with diabetic nephropathy: Secondary | ICD-10-CM | POA: Diagnosis not present

## 2020-04-19 DIAGNOSIS — E7800 Pure hypercholesterolemia, unspecified: Secondary | ICD-10-CM | POA: Diagnosis not present

## 2020-05-05 DIAGNOSIS — N184 Chronic kidney disease, stage 4 (severe): Secondary | ICD-10-CM | POA: Diagnosis not present

## 2020-05-05 DIAGNOSIS — I129 Hypertensive chronic kidney disease with stage 1 through stage 4 chronic kidney disease, or unspecified chronic kidney disease: Secondary | ICD-10-CM | POA: Diagnosis not present

## 2020-05-09 DIAGNOSIS — I130 Hypertensive heart and chronic kidney disease with heart failure and stage 1 through stage 4 chronic kidney disease, or unspecified chronic kidney disease: Secondary | ICD-10-CM | POA: Diagnosis not present

## 2020-05-09 DIAGNOSIS — N184 Chronic kidney disease, stage 4 (severe): Secondary | ICD-10-CM | POA: Diagnosis not present

## 2020-05-09 DIAGNOSIS — E889 Metabolic disorder, unspecified: Secondary | ICD-10-CM | POA: Diagnosis not present

## 2020-05-09 DIAGNOSIS — E872 Acidosis: Secondary | ICD-10-CM | POA: Diagnosis not present

## 2020-05-09 DIAGNOSIS — I5030 Unspecified diastolic (congestive) heart failure: Secondary | ICD-10-CM | POA: Diagnosis not present

## 2020-05-09 DIAGNOSIS — E1122 Type 2 diabetes mellitus with diabetic chronic kidney disease: Secondary | ICD-10-CM | POA: Diagnosis not present

## 2020-05-09 DIAGNOSIS — R801 Persistent proteinuria, unspecified: Secondary | ICD-10-CM | POA: Diagnosis not present

## 2020-05-09 DIAGNOSIS — M9080 Osteopathy in diseases classified elsewhere, unspecified site: Secondary | ICD-10-CM | POA: Diagnosis not present

## 2020-05-09 DIAGNOSIS — E875 Hyperkalemia: Secondary | ICD-10-CM | POA: Diagnosis not present

## 2020-05-23 DIAGNOSIS — I10 Essential (primary) hypertension: Secondary | ICD-10-CM | POA: Diagnosis not present

## 2020-05-23 DIAGNOSIS — H3411 Central retinal artery occlusion, right eye: Secondary | ICD-10-CM | POA: Diagnosis not present

## 2020-05-23 DIAGNOSIS — Z961 Presence of intraocular lens: Secondary | ICD-10-CM | POA: Diagnosis not present

## 2020-05-23 DIAGNOSIS — H35033 Hypertensive retinopathy, bilateral: Secondary | ICD-10-CM | POA: Diagnosis not present

## 2020-05-23 DIAGNOSIS — E113313 Type 2 diabetes mellitus with moderate nonproliferative diabetic retinopathy with macular edema, bilateral: Secondary | ICD-10-CM | POA: Diagnosis not present

## 2020-05-23 DIAGNOSIS — H40052 Ocular hypertension, left eye: Secondary | ICD-10-CM | POA: Diagnosis not present

## 2020-07-04 DIAGNOSIS — H3411 Central retinal artery occlusion, right eye: Secondary | ICD-10-CM | POA: Diagnosis not present

## 2020-07-04 DIAGNOSIS — Z7984 Long term (current) use of oral hypoglycemic drugs: Secondary | ICD-10-CM | POA: Diagnosis not present

## 2020-07-04 DIAGNOSIS — Z961 Presence of intraocular lens: Secondary | ICD-10-CM | POA: Diagnosis not present

## 2020-07-04 DIAGNOSIS — E113313 Type 2 diabetes mellitus with moderate nonproliferative diabetic retinopathy with macular edema, bilateral: Secondary | ICD-10-CM | POA: Diagnosis not present

## 2020-07-04 DIAGNOSIS — H35033 Hypertensive retinopathy, bilateral: Secondary | ICD-10-CM | POA: Diagnosis not present

## 2020-07-04 DIAGNOSIS — H40052 Ocular hypertension, left eye: Secondary | ICD-10-CM | POA: Diagnosis not present

## 2020-07-11 DIAGNOSIS — E1122 Type 2 diabetes mellitus with diabetic chronic kidney disease: Secondary | ICD-10-CM | POA: Diagnosis not present

## 2020-07-11 DIAGNOSIS — N184 Chronic kidney disease, stage 4 (severe): Secondary | ICD-10-CM | POA: Diagnosis not present

## 2020-07-12 DIAGNOSIS — M9080 Osteopathy in diseases classified elsewhere, unspecified site: Secondary | ICD-10-CM | POA: Diagnosis not present

## 2020-07-12 DIAGNOSIS — D631 Anemia in chronic kidney disease: Secondary | ICD-10-CM | POA: Diagnosis not present

## 2020-07-12 DIAGNOSIS — E889 Metabolic disorder, unspecified: Secondary | ICD-10-CM | POA: Diagnosis not present

## 2020-07-12 DIAGNOSIS — E1122 Type 2 diabetes mellitus with diabetic chronic kidney disease: Secondary | ICD-10-CM | POA: Diagnosis not present

## 2020-07-12 DIAGNOSIS — E875 Hyperkalemia: Secondary | ICD-10-CM | POA: Diagnosis not present

## 2020-07-12 DIAGNOSIS — E872 Acidosis: Secondary | ICD-10-CM | POA: Diagnosis not present

## 2020-07-12 DIAGNOSIS — I129 Hypertensive chronic kidney disease with stage 1 through stage 4 chronic kidney disease, or unspecified chronic kidney disease: Secondary | ICD-10-CM | POA: Diagnosis not present

## 2020-07-12 DIAGNOSIS — N184 Chronic kidney disease, stage 4 (severe): Secondary | ICD-10-CM | POA: Diagnosis not present

## 2020-07-13 DIAGNOSIS — E1121 Type 2 diabetes mellitus with diabetic nephropathy: Secondary | ICD-10-CM | POA: Diagnosis not present

## 2020-07-13 DIAGNOSIS — E1149 Type 2 diabetes mellitus with other diabetic neurological complication: Secondary | ICD-10-CM | POA: Diagnosis not present

## 2020-07-13 DIAGNOSIS — D649 Anemia, unspecified: Secondary | ICD-10-CM | POA: Diagnosis not present

## 2020-07-13 DIAGNOSIS — N184 Chronic kidney disease, stage 4 (severe): Secondary | ICD-10-CM | POA: Diagnosis not present

## 2020-07-13 DIAGNOSIS — E11311 Type 2 diabetes mellitus with unspecified diabetic retinopathy with macular edema: Secondary | ICD-10-CM | POA: Diagnosis not present

## 2020-07-13 DIAGNOSIS — E7800 Pure hypercholesterolemia, unspecified: Secondary | ICD-10-CM | POA: Diagnosis not present

## 2020-07-13 DIAGNOSIS — I10 Essential (primary) hypertension: Secondary | ICD-10-CM | POA: Diagnosis not present

## 2020-08-25 DIAGNOSIS — E113313 Type 2 diabetes mellitus with moderate nonproliferative diabetic retinopathy with macular edema, bilateral: Secondary | ICD-10-CM | POA: Diagnosis not present

## 2020-08-25 DIAGNOSIS — H3411 Central retinal artery occlusion, right eye: Secondary | ICD-10-CM | POA: Diagnosis not present

## 2020-08-25 DIAGNOSIS — H35033 Hypertensive retinopathy, bilateral: Secondary | ICD-10-CM | POA: Diagnosis not present

## 2020-08-25 DIAGNOSIS — Z7984 Long term (current) use of oral hypoglycemic drugs: Secondary | ICD-10-CM | POA: Diagnosis not present

## 2020-08-25 DIAGNOSIS — H40052 Ocular hypertension, left eye: Secondary | ICD-10-CM | POA: Diagnosis not present

## 2020-08-25 DIAGNOSIS — Z961 Presence of intraocular lens: Secondary | ICD-10-CM | POA: Diagnosis not present

## 2020-10-10 DIAGNOSIS — E113313 Type 2 diabetes mellitus with moderate nonproliferative diabetic retinopathy with macular edema, bilateral: Secondary | ICD-10-CM | POA: Diagnosis not present

## 2020-10-10 DIAGNOSIS — Z961 Presence of intraocular lens: Secondary | ICD-10-CM | POA: Diagnosis not present

## 2020-10-10 DIAGNOSIS — Z7984 Long term (current) use of oral hypoglycemic drugs: Secondary | ICD-10-CM | POA: Diagnosis not present

## 2020-10-10 DIAGNOSIS — H3411 Central retinal artery occlusion, right eye: Secondary | ICD-10-CM | POA: Diagnosis not present

## 2020-10-10 DIAGNOSIS — H35033 Hypertensive retinopathy, bilateral: Secondary | ICD-10-CM | POA: Diagnosis not present

## 2020-10-10 DIAGNOSIS — H40052 Ocular hypertension, left eye: Secondary | ICD-10-CM | POA: Diagnosis not present

## 2020-10-17 IMAGING — CT CT BIOPSY
1 of 2 series · 15 of 30 positions shown, 19 images · non-contrast
Comparison: none

INDICATION: 68-year-old female with anemia referred for bone marrow biopsy

[Series 2: i-spiral 5.0 b40f · axial · 0.98mm/px · z∈[+970,+1110]mm · 15 of 44 slices shown, 19 images]
[im 2/44  mediastinal]
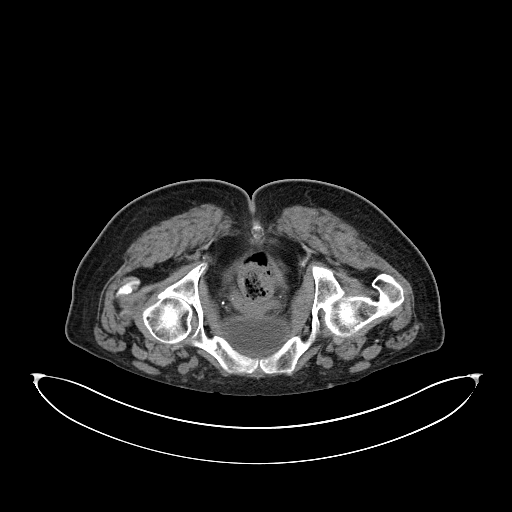
[im 2/44  lung]
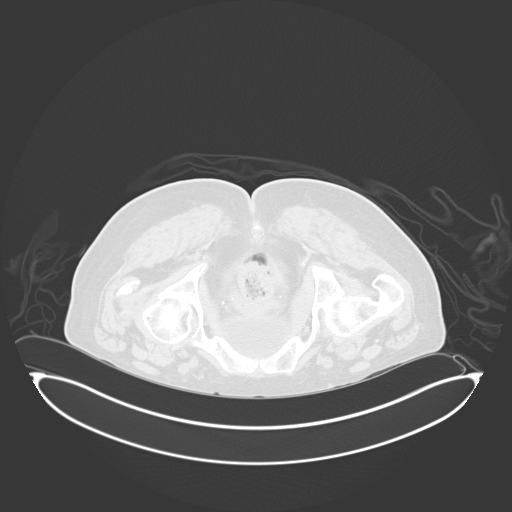
[im 6/44  lung]
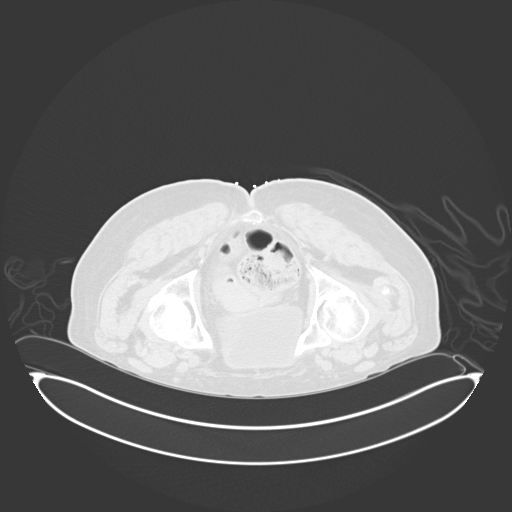
[im 9/44  lung]
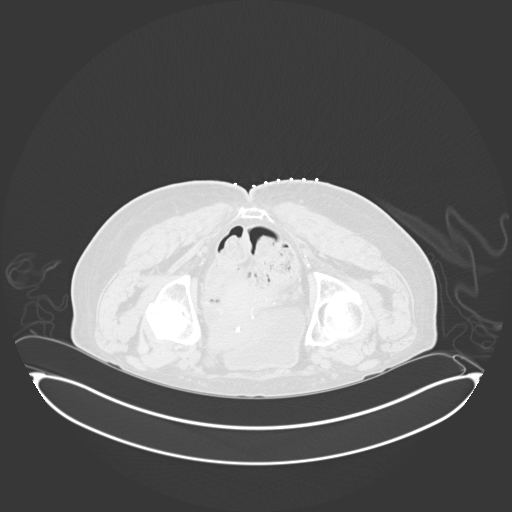
[im 13/44  lung]
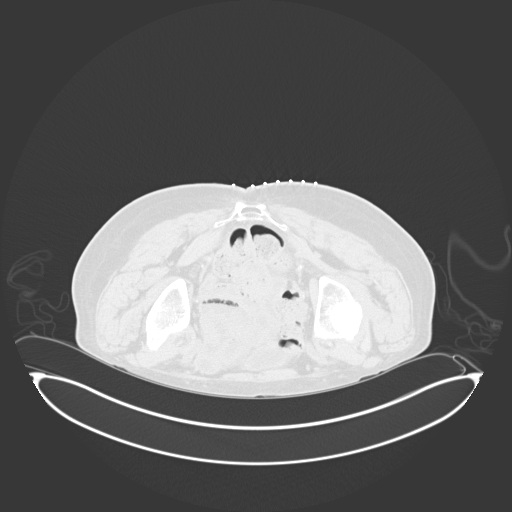
[im 14/44  mediastinal]
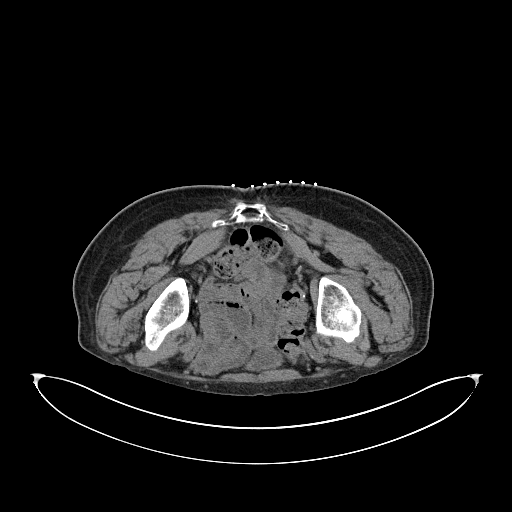
[im 14/44  lung]
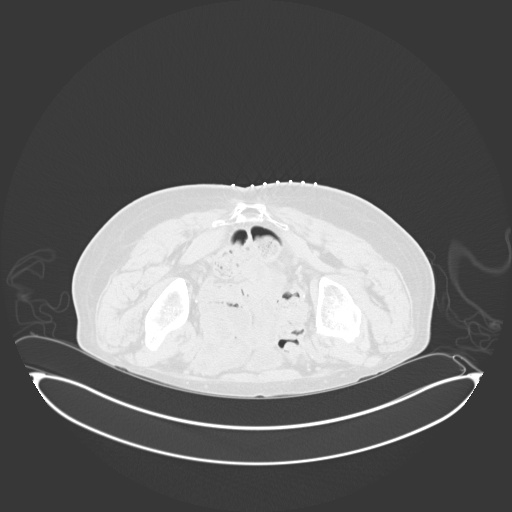
[im 18/44  lung]
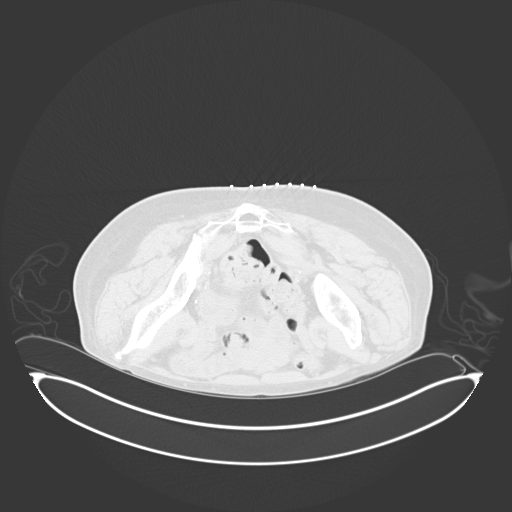
[im 20/44  lung]
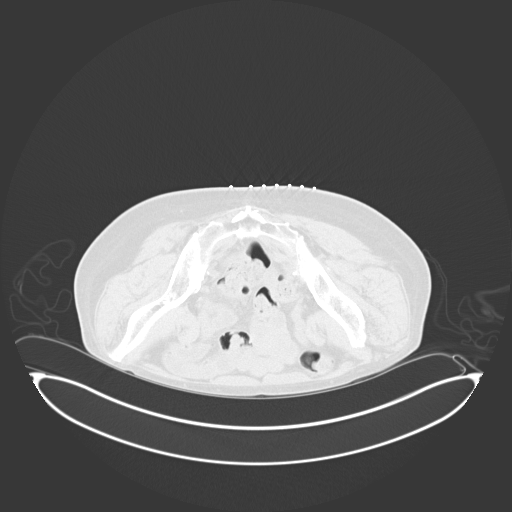
[im 22/44  lung]
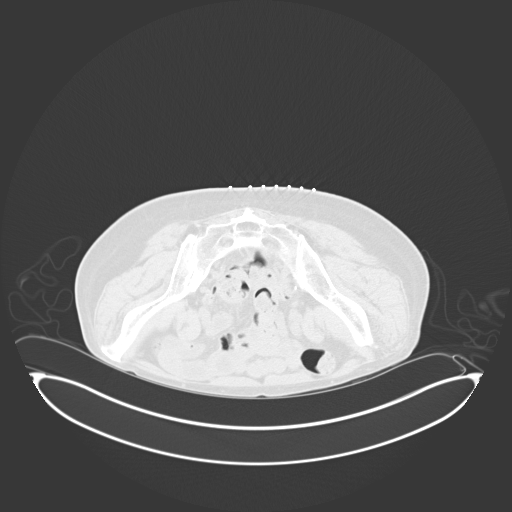
[im 23/44  mediastinal]
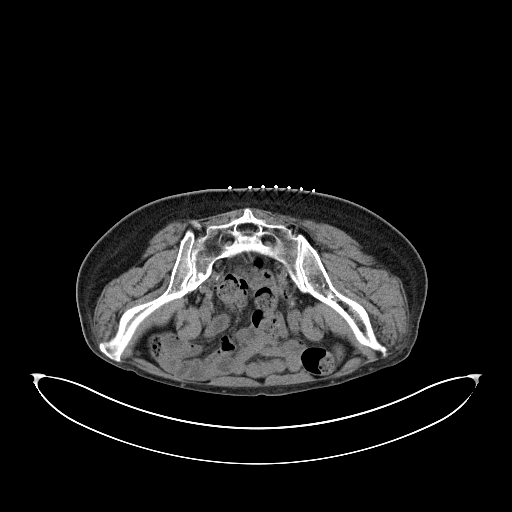
[im 23/44  lung]
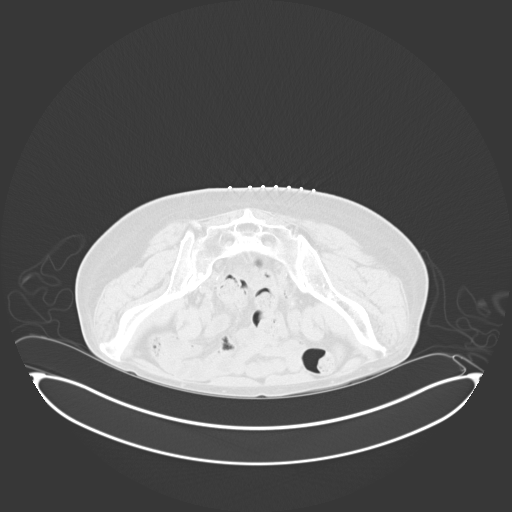
[im 26/44  lung]
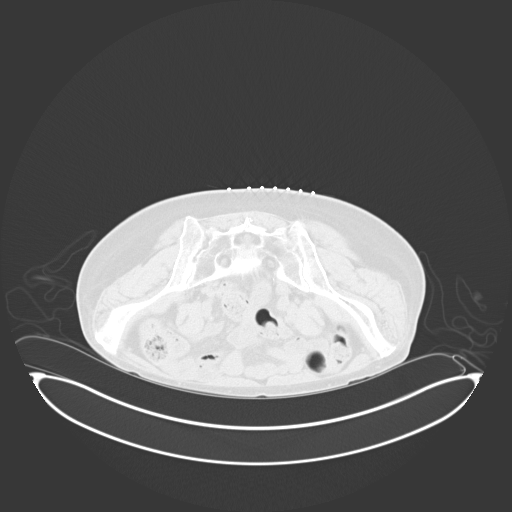
[im 30/44  lung]
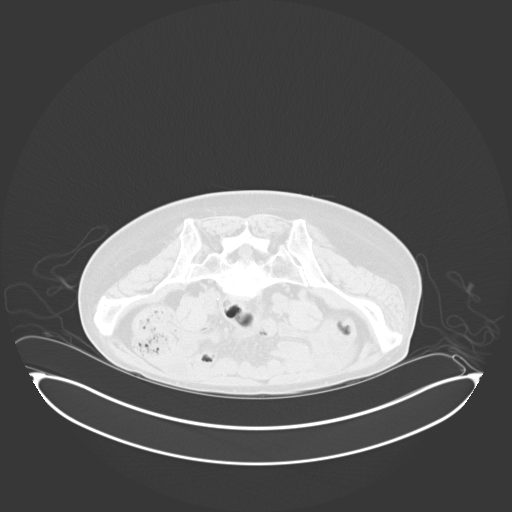
[im 31/44  lung]
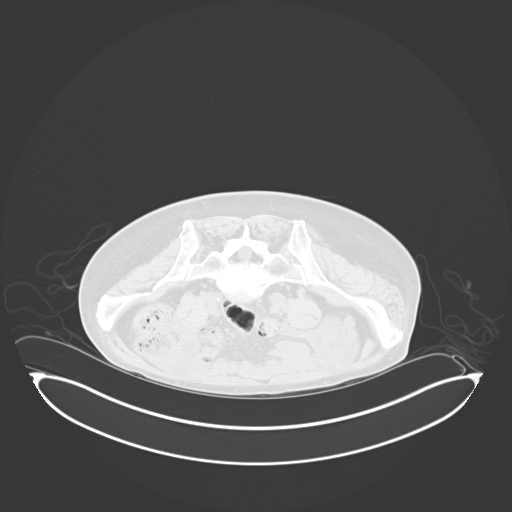
[im 35/44  mediastinal]
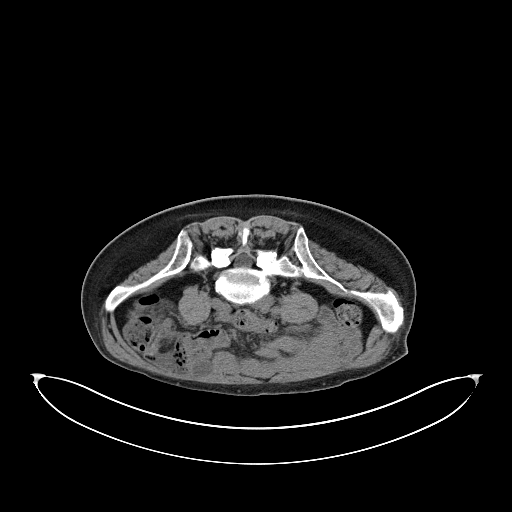
[im 35/44  lung]
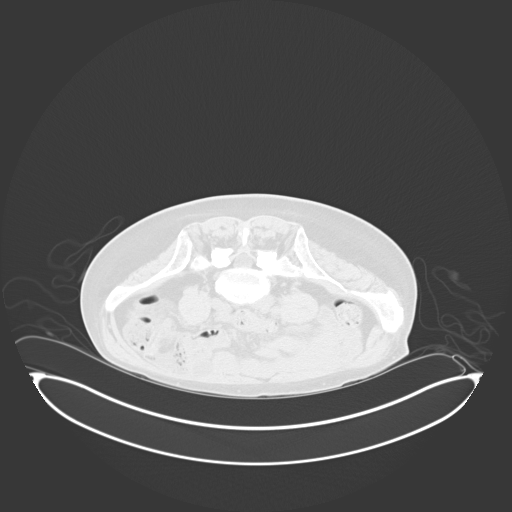
[im 38/44  lung]
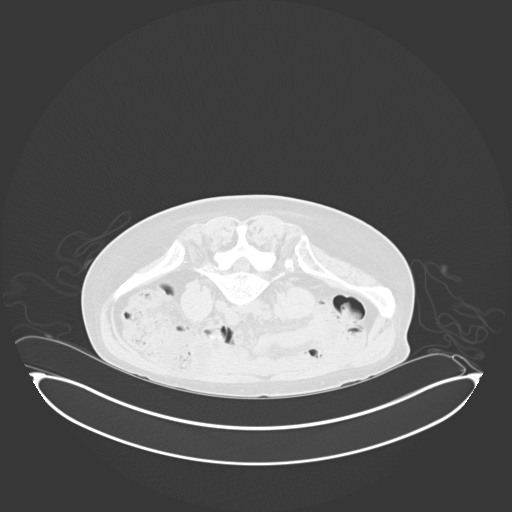
[im 42/44  lung]
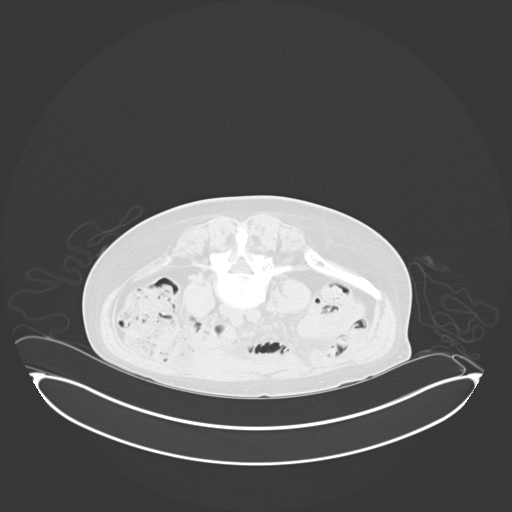

[15 of 30 positions shown; findings below may reference images not displayed]

EXAM:
CT BIOPSY; CT BONE MARROW BIOPSY AND ASPIRATION

MEDICATIONS:
None.

ANESTHESIA/SEDATION:
Moderate (conscious) sedation was employed during this procedure. A
total of Versed 0 mg and Fentanyl 50 mcg was administered
intravenously.

Moderate Sedation Time: 0 minutes. The patient's level of
consciousness and vital signs were monitored continuously by
radiology nursing throughout the procedure under my direct
supervision.

FLUOROSCOPY TIME:  CT

COMPLICATIONS:
None

PROCEDURE:
The procedure risks, benefits, and alternatives were explained to
the patient. Questions regarding the procedure were encouraged and
answered. The patient understands and consents to the procedure.

Scout CT of the pelvis was performed for surgical planning purposes.

The left posterior pelvis was prepped with Chlorhexidine in a
sterile fashion, and a sterile drape was applied covering the
operative field. A sterile gown and sterile gloves were used for the
procedure. Local anesthesia was provided with 1% Lidocaine.

Posterior left iliac bone was targeted for biopsy. The skin and
subcutaneous tissues were infiltrated with 1% lidocaine without
epinephrine. A small stab incision was made with an 11 blade
scalpel, and an 11 gauge Raygoza needle was advanced with CT guidance
to the posterior cortex. Manual forced was used to advance the
needle through the posterior cortex and the stylet was removed. A
bone marrow aspirate was retrieved and passed to a cytotechnologist
in the room. The Raygoza needle was then advanced without the stylet
for a core biopsy. The core biopsy was retrieved and also passed to
a cytotechnologist.

Manual pressure was used for hemostasis and a sterile dressing was
placed.

No complications were encountered no significant blood loss was
encountered.

Patient tolerated the procedure well and remained hemodynamically
stable throughout.
IMPRESSION: Status post CT-guided bone marrow biopsy, with tissue specimen sent
to pathology for complete histopathologic analysis

## 2020-10-18 DIAGNOSIS — N184 Chronic kidney disease, stage 4 (severe): Secondary | ICD-10-CM | POA: Diagnosis not present

## 2020-10-18 DIAGNOSIS — E7800 Pure hypercholesterolemia, unspecified: Secondary | ICD-10-CM | POA: Diagnosis not present

## 2020-10-18 DIAGNOSIS — E1121 Type 2 diabetes mellitus with diabetic nephropathy: Secondary | ICD-10-CM | POA: Diagnosis not present

## 2020-10-18 DIAGNOSIS — Z79899 Other long term (current) drug therapy: Secondary | ICD-10-CM | POA: Diagnosis not present

## 2020-10-18 DIAGNOSIS — I10 Essential (primary) hypertension: Secondary | ICD-10-CM | POA: Diagnosis not present

## 2020-11-02 DIAGNOSIS — D649 Anemia, unspecified: Secondary | ICD-10-CM | POA: Diagnosis not present

## 2020-11-02 DIAGNOSIS — E11311 Type 2 diabetes mellitus with unspecified diabetic retinopathy with macular edema: Secondary | ICD-10-CM | POA: Diagnosis not present

## 2020-11-02 DIAGNOSIS — E1149 Type 2 diabetes mellitus with other diabetic neurological complication: Secondary | ICD-10-CM | POA: Diagnosis not present

## 2020-11-02 DIAGNOSIS — E7800 Pure hypercholesterolemia, unspecified: Secondary | ICD-10-CM | POA: Diagnosis not present

## 2020-11-02 DIAGNOSIS — I10 Essential (primary) hypertension: Secondary | ICD-10-CM | POA: Diagnosis not present

## 2020-11-02 DIAGNOSIS — E1121 Type 2 diabetes mellitus with diabetic nephropathy: Secondary | ICD-10-CM | POA: Diagnosis not present

## 2020-11-02 DIAGNOSIS — N184 Chronic kidney disease, stage 4 (severe): Secondary | ICD-10-CM | POA: Diagnosis not present

## 2020-11-07 DIAGNOSIS — I10 Essential (primary) hypertension: Secondary | ICD-10-CM | POA: Diagnosis not present

## 2020-11-07 DIAGNOSIS — D649 Anemia, unspecified: Secondary | ICD-10-CM | POA: Diagnosis not present

## 2020-11-07 DIAGNOSIS — N184 Chronic kidney disease, stage 4 (severe): Secondary | ICD-10-CM | POA: Diagnosis not present

## 2020-11-07 DIAGNOSIS — E1121 Type 2 diabetes mellitus with diabetic nephropathy: Secondary | ICD-10-CM | POA: Diagnosis not present

## 2020-11-07 DIAGNOSIS — E7800 Pure hypercholesterolemia, unspecified: Secondary | ICD-10-CM | POA: Diagnosis not present

## 2020-11-07 DIAGNOSIS — E1149 Type 2 diabetes mellitus with other diabetic neurological complication: Secondary | ICD-10-CM | POA: Diagnosis not present

## 2020-11-07 DIAGNOSIS — E11311 Type 2 diabetes mellitus with unspecified diabetic retinopathy with macular edema: Secondary | ICD-10-CM | POA: Diagnosis not present

## 2020-11-14 DIAGNOSIS — Z23 Encounter for immunization: Secondary | ICD-10-CM | POA: Diagnosis not present

## 2020-11-14 DIAGNOSIS — Z0000 Encounter for general adult medical examination without abnormal findings: Secondary | ICD-10-CM | POA: Diagnosis not present

## 2020-11-21 ENCOUNTER — Other Ambulatory Visit: Payer: Self-pay | Admitting: Family Medicine

## 2020-11-21 DIAGNOSIS — Z1231 Encounter for screening mammogram for malignant neoplasm of breast: Secondary | ICD-10-CM

## 2020-11-21 DIAGNOSIS — E2839 Other primary ovarian failure: Secondary | ICD-10-CM

## 2020-11-24 DIAGNOSIS — N184 Chronic kidney disease, stage 4 (severe): Secondary | ICD-10-CM | POA: Diagnosis not present

## 2020-11-24 DIAGNOSIS — I129 Hypertensive chronic kidney disease with stage 1 through stage 4 chronic kidney disease, or unspecified chronic kidney disease: Secondary | ICD-10-CM | POA: Diagnosis not present

## 2020-11-27 DIAGNOSIS — N184 Chronic kidney disease, stage 4 (severe): Secondary | ICD-10-CM | POA: Diagnosis not present

## 2020-11-27 DIAGNOSIS — E559 Vitamin D deficiency, unspecified: Secondary | ICD-10-CM | POA: Diagnosis not present

## 2020-11-27 DIAGNOSIS — E872 Acidosis, unspecified: Secondary | ICD-10-CM | POA: Diagnosis not present

## 2020-11-27 DIAGNOSIS — I129 Hypertensive chronic kidney disease with stage 1 through stage 4 chronic kidney disease, or unspecified chronic kidney disease: Secondary | ICD-10-CM | POA: Diagnosis not present

## 2020-11-27 DIAGNOSIS — M898X9 Other specified disorders of bone, unspecified site: Secondary | ICD-10-CM | POA: Diagnosis not present

## 2020-11-27 DIAGNOSIS — E1122 Type 2 diabetes mellitus with diabetic chronic kidney disease: Secondary | ICD-10-CM | POA: Diagnosis not present

## 2020-11-27 DIAGNOSIS — D631 Anemia in chronic kidney disease: Secondary | ICD-10-CM | POA: Diagnosis not present

## 2020-11-27 DIAGNOSIS — E875 Hyperkalemia: Secondary | ICD-10-CM | POA: Diagnosis not present

## 2020-12-12 DIAGNOSIS — H40052 Ocular hypertension, left eye: Secondary | ICD-10-CM | POA: Diagnosis not present

## 2020-12-12 DIAGNOSIS — H3411 Central retinal artery occlusion, right eye: Secondary | ICD-10-CM | POA: Diagnosis not present

## 2020-12-12 DIAGNOSIS — Z7984 Long term (current) use of oral hypoglycemic drugs: Secondary | ICD-10-CM | POA: Diagnosis not present

## 2020-12-12 DIAGNOSIS — H35033 Hypertensive retinopathy, bilateral: Secondary | ICD-10-CM | POA: Diagnosis not present

## 2020-12-12 DIAGNOSIS — E113313 Type 2 diabetes mellitus with moderate nonproliferative diabetic retinopathy with macular edema, bilateral: Secondary | ICD-10-CM | POA: Diagnosis not present

## 2020-12-12 DIAGNOSIS — Z961 Presence of intraocular lens: Secondary | ICD-10-CM | POA: Diagnosis not present

## 2021-01-04 ENCOUNTER — Encounter: Payer: Self-pay | Admitting: Hematology

## 2021-01-11 ENCOUNTER — Other Ambulatory Visit: Payer: Medicare HMO

## 2021-01-11 ENCOUNTER — Inpatient Hospital Stay: Admission: RE | Admit: 2021-01-11 | Payer: Medicare HMO | Source: Ambulatory Visit

## 2021-01-25 ENCOUNTER — Ambulatory Visit
Admission: RE | Admit: 2021-01-25 | Discharge: 2021-01-25 | Disposition: A | Discharge status: Home / Self Care | Payer: Medicare HMO | Source: Ambulatory Visit | Attending: Family Medicine | Admitting: Family Medicine

## 2021-01-25 DIAGNOSIS — Z1231 Encounter for screening mammogram for malignant neoplasm of breast: Secondary | ICD-10-CM | POA: Diagnosis not present

## 2021-01-25 DIAGNOSIS — Z780 Asymptomatic menopausal state: Secondary | ICD-10-CM | POA: Diagnosis not present

## 2021-01-25 DIAGNOSIS — M810 Age-related osteoporosis without current pathological fracture: Secondary | ICD-10-CM | POA: Diagnosis not present

## 2021-01-25 DIAGNOSIS — E2839 Other primary ovarian failure: Secondary | ICD-10-CM

## 2021-02-02 DIAGNOSIS — E11311 Type 2 diabetes mellitus with unspecified diabetic retinopathy with macular edema: Secondary | ICD-10-CM | POA: Diagnosis not present

## 2021-02-02 DIAGNOSIS — E1121 Type 2 diabetes mellitus with diabetic nephropathy: Secondary | ICD-10-CM | POA: Diagnosis not present

## 2021-02-02 DIAGNOSIS — E1149 Type 2 diabetes mellitus with other diabetic neurological complication: Secondary | ICD-10-CM | POA: Diagnosis not present

## 2021-02-02 DIAGNOSIS — E7800 Pure hypercholesterolemia, unspecified: Secondary | ICD-10-CM | POA: Diagnosis not present

## 2021-02-02 DIAGNOSIS — I10 Essential (primary) hypertension: Secondary | ICD-10-CM | POA: Diagnosis not present

## 2021-02-13 DIAGNOSIS — E113313 Type 2 diabetes mellitus with moderate nonproliferative diabetic retinopathy with macular edema, bilateral: Secondary | ICD-10-CM | POA: Diagnosis not present

## 2021-03-26 DIAGNOSIS — I129 Hypertensive chronic kidney disease with stage 1 through stage 4 chronic kidney disease, or unspecified chronic kidney disease: Secondary | ICD-10-CM | POA: Diagnosis not present

## 2021-03-26 DIAGNOSIS — N184 Chronic kidney disease, stage 4 (severe): Secondary | ICD-10-CM | POA: Diagnosis not present

## 2021-03-29 DIAGNOSIS — Z7984 Long term (current) use of oral hypoglycemic drugs: Secondary | ICD-10-CM | POA: Diagnosis not present

## 2021-03-29 DIAGNOSIS — N184 Chronic kidney disease, stage 4 (severe): Secondary | ICD-10-CM | POA: Diagnosis not present

## 2021-03-29 DIAGNOSIS — R801 Persistent proteinuria, unspecified: Secondary | ICD-10-CM | POA: Diagnosis not present

## 2021-03-29 DIAGNOSIS — I129 Hypertensive chronic kidney disease with stage 1 through stage 4 chronic kidney disease, or unspecified chronic kidney disease: Secondary | ICD-10-CM | POA: Diagnosis not present

## 2021-03-29 DIAGNOSIS — D631 Anemia in chronic kidney disease: Secondary | ICD-10-CM | POA: Diagnosis not present

## 2021-03-29 DIAGNOSIS — E872 Acidosis, unspecified: Secondary | ICD-10-CM | POA: Diagnosis not present

## 2021-03-29 DIAGNOSIS — E1122 Type 2 diabetes mellitus with diabetic chronic kidney disease: Secondary | ICD-10-CM | POA: Diagnosis not present

## 2021-04-10 DIAGNOSIS — E113313 Type 2 diabetes mellitus with moderate nonproliferative diabetic retinopathy with macular edema, bilateral: Secondary | ICD-10-CM | POA: Diagnosis not present

## 2021-06-19 DIAGNOSIS — Z7984 Long term (current) use of oral hypoglycemic drugs: Secondary | ICD-10-CM | POA: Diagnosis not present

## 2021-06-19 DIAGNOSIS — E113313 Type 2 diabetes mellitus with moderate nonproliferative diabetic retinopathy with macular edema, bilateral: Secondary | ICD-10-CM | POA: Diagnosis not present

## 2021-08-01 DIAGNOSIS — I129 Hypertensive chronic kidney disease with stage 1 through stage 4 chronic kidney disease, or unspecified chronic kidney disease: Secondary | ICD-10-CM | POA: Diagnosis not present

## 2021-08-01 DIAGNOSIS — R801 Persistent proteinuria, unspecified: Secondary | ICD-10-CM | POA: Diagnosis not present

## 2021-08-01 DIAGNOSIS — N184 Chronic kidney disease, stage 4 (severe): Secondary | ICD-10-CM | POA: Diagnosis not present

## 2021-08-01 DIAGNOSIS — E1122 Type 2 diabetes mellitus with diabetic chronic kidney disease: Secondary | ICD-10-CM | POA: Diagnosis not present

## 2021-08-02 DIAGNOSIS — R801 Persistent proteinuria, unspecified: Secondary | ICD-10-CM | POA: Diagnosis not present

## 2021-08-02 DIAGNOSIS — E1122 Type 2 diabetes mellitus with diabetic chronic kidney disease: Secondary | ICD-10-CM | POA: Diagnosis not present

## 2021-08-02 DIAGNOSIS — N184 Chronic kidney disease, stage 4 (severe): Secondary | ICD-10-CM | POA: Diagnosis not present

## 2021-08-02 DIAGNOSIS — Z7984 Long term (current) use of oral hypoglycemic drugs: Secondary | ICD-10-CM | POA: Diagnosis not present

## 2021-08-02 DIAGNOSIS — E875 Hyperkalemia: Secondary | ICD-10-CM | POA: Diagnosis not present

## 2021-08-02 DIAGNOSIS — E872 Acidosis, unspecified: Secondary | ICD-10-CM | POA: Diagnosis not present

## 2021-08-02 DIAGNOSIS — I130 Hypertensive heart and chronic kidney disease with heart failure and stage 1 through stage 4 chronic kidney disease, or unspecified chronic kidney disease: Secondary | ICD-10-CM | POA: Diagnosis not present

## 2021-08-02 DIAGNOSIS — D631 Anemia in chronic kidney disease: Secondary | ICD-10-CM | POA: Diagnosis not present

## 2021-08-02 DIAGNOSIS — I5030 Unspecified diastolic (congestive) heart failure: Secondary | ICD-10-CM | POA: Diagnosis not present

## 2021-08-24 DIAGNOSIS — H3411 Central retinal artery occlusion, right eye: Secondary | ICD-10-CM | POA: Diagnosis not present

## 2021-08-24 DIAGNOSIS — H40052 Ocular hypertension, left eye: Secondary | ICD-10-CM | POA: Diagnosis not present

## 2021-08-24 DIAGNOSIS — Z961 Presence of intraocular lens: Secondary | ICD-10-CM | POA: Diagnosis not present

## 2021-08-24 DIAGNOSIS — E113313 Type 2 diabetes mellitus with moderate nonproliferative diabetic retinopathy with macular edema, bilateral: Secondary | ICD-10-CM | POA: Diagnosis not present

## 2021-08-24 DIAGNOSIS — H35033 Hypertensive retinopathy, bilateral: Secondary | ICD-10-CM | POA: Diagnosis not present

## 2021-08-24 DIAGNOSIS — Z7984 Long term (current) use of oral hypoglycemic drugs: Secondary | ICD-10-CM | POA: Diagnosis not present

## 2021-09-06 DIAGNOSIS — I10 Essential (primary) hypertension: Secondary | ICD-10-CM | POA: Diagnosis not present

## 2021-09-06 DIAGNOSIS — E7800 Pure hypercholesterolemia, unspecified: Secondary | ICD-10-CM | POA: Diagnosis not present

## 2021-09-06 DIAGNOSIS — E1121 Type 2 diabetes mellitus with diabetic nephropathy: Secondary | ICD-10-CM | POA: Diagnosis not present

## 2021-09-06 DIAGNOSIS — N184 Chronic kidney disease, stage 4 (severe): Secondary | ICD-10-CM | POA: Diagnosis not present

## 2021-09-26 DIAGNOSIS — E113313 Type 2 diabetes mellitus with moderate nonproliferative diabetic retinopathy with macular edema, bilateral: Secondary | ICD-10-CM | POA: Diagnosis not present

## 2021-09-26 DIAGNOSIS — H2511 Age-related nuclear cataract, right eye: Secondary | ICD-10-CM | POA: Diagnosis not present

## 2021-09-26 DIAGNOSIS — Z961 Presence of intraocular lens: Secondary | ICD-10-CM | POA: Diagnosis not present

## 2021-09-26 DIAGNOSIS — H3411 Central retinal artery occlusion, right eye: Secondary | ICD-10-CM | POA: Diagnosis not present

## 2021-09-26 DIAGNOSIS — H40052 Ocular hypertension, left eye: Secondary | ICD-10-CM | POA: Diagnosis not present

## 2021-09-28 DIAGNOSIS — G479 Sleep disorder, unspecified: Secondary | ICD-10-CM | POA: Diagnosis not present

## 2021-09-28 DIAGNOSIS — E1149 Type 2 diabetes mellitus with other diabetic neurological complication: Secondary | ICD-10-CM | POA: Diagnosis not present

## 2021-09-28 DIAGNOSIS — N184 Chronic kidney disease, stage 4 (severe): Secondary | ICD-10-CM | POA: Diagnosis not present

## 2021-09-28 DIAGNOSIS — I10 Essential (primary) hypertension: Secondary | ICD-10-CM | POA: Diagnosis not present

## 2021-09-28 DIAGNOSIS — E7800 Pure hypercholesterolemia, unspecified: Secondary | ICD-10-CM | POA: Diagnosis not present

## 2021-10-26 DIAGNOSIS — H3411 Central retinal artery occlusion, right eye: Secondary | ICD-10-CM | POA: Diagnosis not present

## 2021-10-26 DIAGNOSIS — Z961 Presence of intraocular lens: Secondary | ICD-10-CM | POA: Diagnosis not present

## 2021-10-26 DIAGNOSIS — H35033 Hypertensive retinopathy, bilateral: Secondary | ICD-10-CM | POA: Diagnosis not present

## 2021-10-26 DIAGNOSIS — H40052 Ocular hypertension, left eye: Secondary | ICD-10-CM | POA: Diagnosis not present

## 2021-10-26 DIAGNOSIS — Z7984 Long term (current) use of oral hypoglycemic drugs: Secondary | ICD-10-CM | POA: Diagnosis not present

## 2021-10-26 DIAGNOSIS — E113313 Type 2 diabetes mellitus with moderate nonproliferative diabetic retinopathy with macular edema, bilateral: Secondary | ICD-10-CM | POA: Diagnosis not present

## 2021-11-02 DIAGNOSIS — E1122 Type 2 diabetes mellitus with diabetic chronic kidney disease: Secondary | ICD-10-CM | POA: Diagnosis not present

## 2021-11-02 DIAGNOSIS — N184 Chronic kidney disease, stage 4 (severe): Secondary | ICD-10-CM | POA: Diagnosis not present

## 2021-11-06 DIAGNOSIS — I130 Hypertensive heart and chronic kidney disease with heart failure and stage 1 through stage 4 chronic kidney disease, or unspecified chronic kidney disease: Secondary | ICD-10-CM | POA: Diagnosis not present

## 2021-11-06 DIAGNOSIS — I5030 Unspecified diastolic (congestive) heart failure: Secondary | ICD-10-CM | POA: Diagnosis not present

## 2021-11-06 DIAGNOSIS — R801 Persistent proteinuria, unspecified: Secondary | ICD-10-CM | POA: Diagnosis not present

## 2021-11-06 DIAGNOSIS — Z7984 Long term (current) use of oral hypoglycemic drugs: Secondary | ICD-10-CM | POA: Diagnosis not present

## 2021-11-06 DIAGNOSIS — E113219 Type 2 diabetes mellitus with mild nonproliferative diabetic retinopathy with macular edema, unspecified eye: Secondary | ICD-10-CM | POA: Diagnosis not present

## 2021-11-06 DIAGNOSIS — E1122 Type 2 diabetes mellitus with diabetic chronic kidney disease: Secondary | ICD-10-CM | POA: Diagnosis not present

## 2021-11-06 DIAGNOSIS — N184 Chronic kidney disease, stage 4 (severe): Secondary | ICD-10-CM | POA: Diagnosis not present

## 2021-11-06 DIAGNOSIS — D631 Anemia in chronic kidney disease: Secondary | ICD-10-CM | POA: Diagnosis not present

## 2021-11-16 DIAGNOSIS — Z0000 Encounter for general adult medical examination without abnormal findings: Secondary | ICD-10-CM | POA: Diagnosis not present

## 2021-11-16 DIAGNOSIS — E7800 Pure hypercholesterolemia, unspecified: Secondary | ICD-10-CM | POA: Diagnosis not present

## 2021-11-16 DIAGNOSIS — Z23 Encounter for immunization: Secondary | ICD-10-CM | POA: Diagnosis not present

## 2021-11-16 DIAGNOSIS — N184 Chronic kidney disease, stage 4 (severe): Secondary | ICD-10-CM | POA: Diagnosis not present

## 2021-11-16 DIAGNOSIS — E1121 Type 2 diabetes mellitus with diabetic nephropathy: Secondary | ICD-10-CM | POA: Diagnosis not present

## 2022-01-04 DIAGNOSIS — E113313 Type 2 diabetes mellitus with moderate nonproliferative diabetic retinopathy with macular edema, bilateral: Secondary | ICD-10-CM | POA: Diagnosis not present

## 2022-03-05 DIAGNOSIS — E113313 Type 2 diabetes mellitus with moderate nonproliferative diabetic retinopathy with macular edema, bilateral: Secondary | ICD-10-CM | POA: Diagnosis not present

## 2022-03-05 DIAGNOSIS — H3411 Central retinal artery occlusion, right eye: Secondary | ICD-10-CM | POA: Diagnosis not present

## 2022-03-05 DIAGNOSIS — H35033 Hypertensive retinopathy, bilateral: Secondary | ICD-10-CM | POA: Diagnosis not present

## 2022-03-05 DIAGNOSIS — H40052 Ocular hypertension, left eye: Secondary | ICD-10-CM | POA: Diagnosis not present

## 2022-03-05 DIAGNOSIS — Z961 Presence of intraocular lens: Secondary | ICD-10-CM | POA: Diagnosis not present

## 2022-03-13 DIAGNOSIS — N184 Chronic kidney disease, stage 4 (severe): Secondary | ICD-10-CM | POA: Diagnosis not present

## 2022-03-13 DIAGNOSIS — E1122 Type 2 diabetes mellitus with diabetic chronic kidney disease: Secondary | ICD-10-CM | POA: Diagnosis not present

## 2022-03-13 DIAGNOSIS — I129 Hypertensive chronic kidney disease with stage 1 through stage 4 chronic kidney disease, or unspecified chronic kidney disease: Secondary | ICD-10-CM | POA: Diagnosis not present

## 2022-03-14 DIAGNOSIS — E1122 Type 2 diabetes mellitus with diabetic chronic kidney disease: Secondary | ICD-10-CM | POA: Diagnosis not present

## 2022-03-14 DIAGNOSIS — N184 Chronic kidney disease, stage 4 (severe): Secondary | ICD-10-CM | POA: Diagnosis not present

## 2022-03-14 DIAGNOSIS — M898X9 Other specified disorders of bone, unspecified site: Secondary | ICD-10-CM | POA: Diagnosis not present

## 2022-03-14 DIAGNOSIS — I129 Hypertensive chronic kidney disease with stage 1 through stage 4 chronic kidney disease, or unspecified chronic kidney disease: Secondary | ICD-10-CM | POA: Diagnosis not present

## 2022-03-14 DIAGNOSIS — D631 Anemia in chronic kidney disease: Secondary | ICD-10-CM | POA: Diagnosis not present

## 2022-03-14 DIAGNOSIS — E559 Vitamin D deficiency, unspecified: Secondary | ICD-10-CM | POA: Diagnosis not present

## 2022-03-14 DIAGNOSIS — R801 Persistent proteinuria, unspecified: Secondary | ICD-10-CM | POA: Diagnosis not present

## 2022-05-07 DIAGNOSIS — E113313 Type 2 diabetes mellitus with moderate nonproliferative diabetic retinopathy with macular edema, bilateral: Secondary | ICD-10-CM | POA: Diagnosis not present

## 2022-05-28 DIAGNOSIS — E1122 Type 2 diabetes mellitus with diabetic chronic kidney disease: Secondary | ICD-10-CM | POA: Diagnosis not present

## 2022-05-28 DIAGNOSIS — N184 Chronic kidney disease, stage 4 (severe): Secondary | ICD-10-CM | POA: Diagnosis not present

## 2022-05-28 DIAGNOSIS — E1121 Type 2 diabetes mellitus with diabetic nephropathy: Secondary | ICD-10-CM | POA: Diagnosis not present

## 2022-05-28 DIAGNOSIS — E7800 Pure hypercholesterolemia, unspecified: Secondary | ICD-10-CM | POA: Diagnosis not present

## 2022-05-28 DIAGNOSIS — I10 Essential (primary) hypertension: Secondary | ICD-10-CM | POA: Diagnosis not present

## 2022-05-28 DIAGNOSIS — Z6825 Body mass index (BMI) 25.0-25.9, adult: Secondary | ICD-10-CM | POA: Diagnosis not present

## 2022-05-28 DIAGNOSIS — G479 Sleep disorder, unspecified: Secondary | ICD-10-CM | POA: Diagnosis not present

## 2022-06-21 ENCOUNTER — Other Ambulatory Visit: Payer: Self-pay | Admitting: Family Medicine

## 2022-06-21 DIAGNOSIS — Z1231 Encounter for screening mammogram for malignant neoplasm of breast: Secondary | ICD-10-CM

## 2022-07-09 DIAGNOSIS — E113313 Type 2 diabetes mellitus with moderate nonproliferative diabetic retinopathy with macular edema, bilateral: Secondary | ICD-10-CM | POA: Diagnosis not present

## 2022-07-09 DIAGNOSIS — H35033 Hypertensive retinopathy, bilateral: Secondary | ICD-10-CM | POA: Diagnosis not present

## 2022-07-10 ENCOUNTER — Ambulatory Visit
Admission: RE | Admit: 2022-07-10 | Discharge: 2022-07-10 | Disposition: A | Discharge status: Home / Self Care | Payer: Medicare HMO | Source: Ambulatory Visit | Attending: Family Medicine | Admitting: Family Medicine

## 2022-07-10 DIAGNOSIS — Z1231 Encounter for screening mammogram for malignant neoplasm of breast: Secondary | ICD-10-CM

## 2022-07-12 IMAGING — MG MM DIGITAL SCREENING BILAT W/ TOMO AND CAD
8 series · 9 of 24 positions shown · non-contrast
Comparison: Previous exam(s).

CLINICAL DATA: Screening.

EXAM:
DIGITAL SCREENING BILATERAL MAMMOGRAM WITH TOMOSYNTHESIS AND CAD
TECHNIQUE: Bilateral screening digital craniocaudal and mediolateral oblique
mammograms were obtained. Bilateral screening digital breast
tomosynthesis was performed. The images were evaluated with
computer-aided detection.

[R MLO synth-2D]
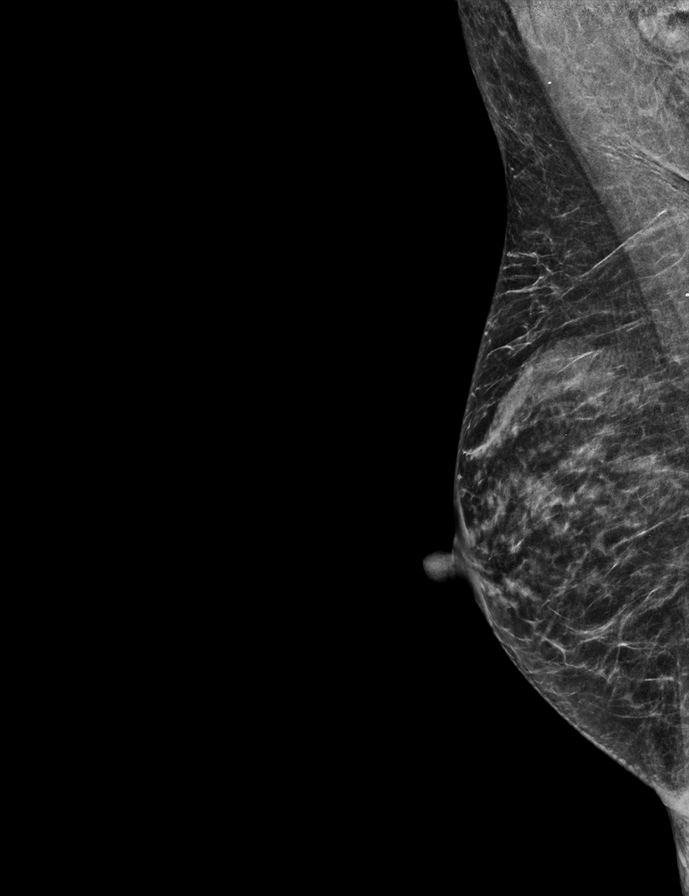

[R CC synth-2D]
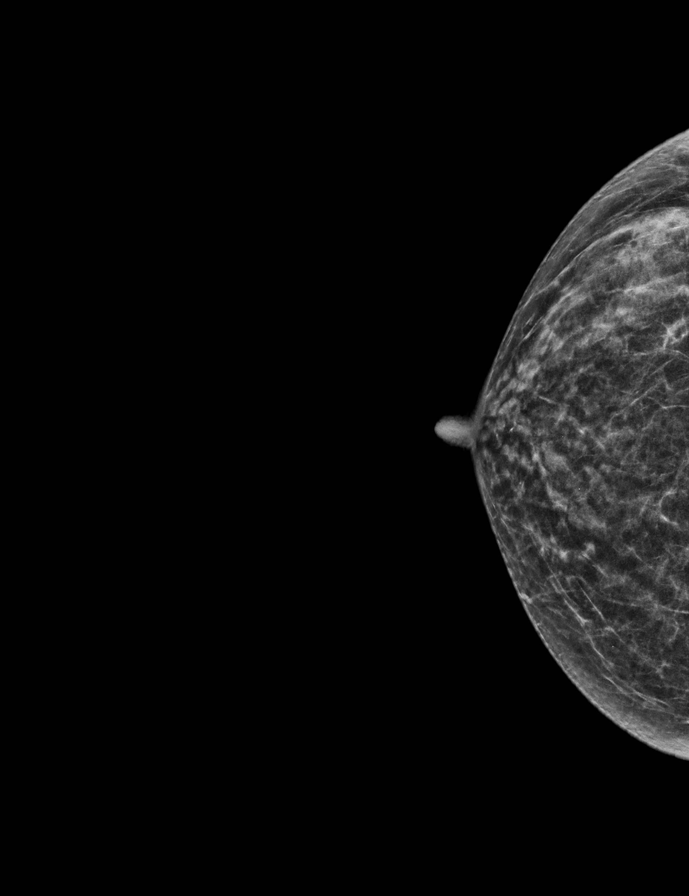

[L MLO synth-2D]
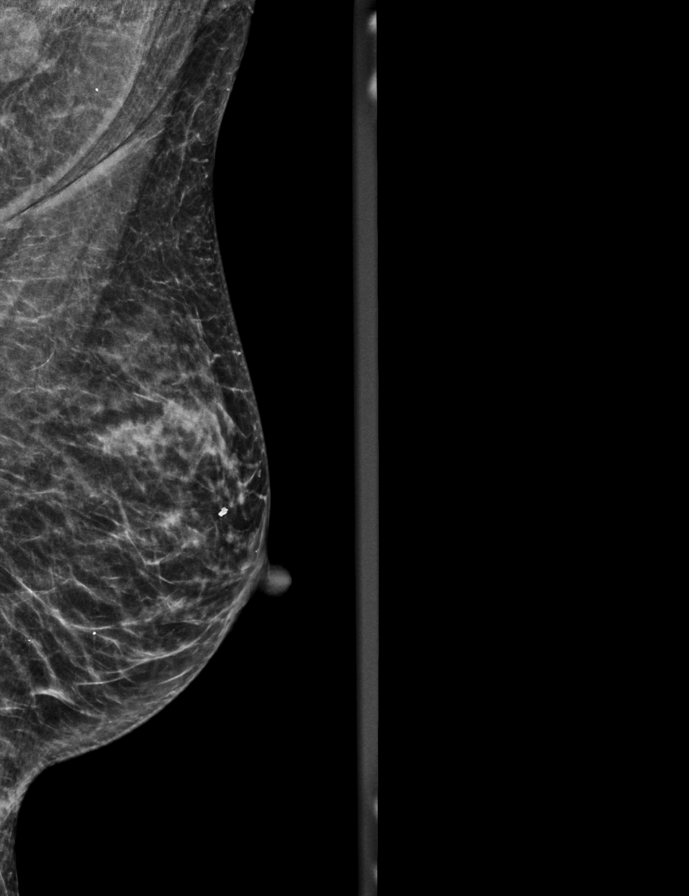

[L CC synth-2D]
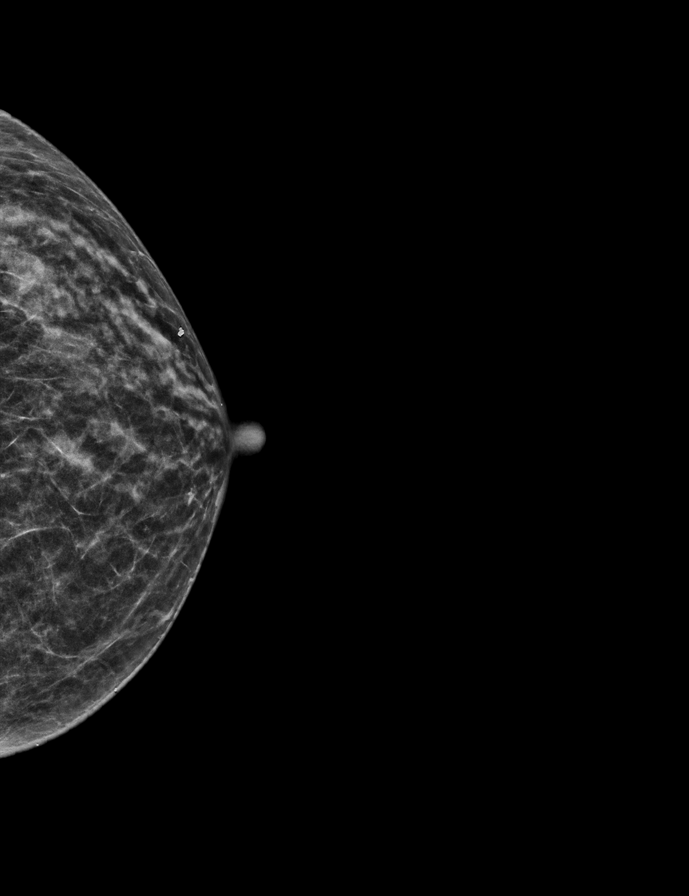

[R MLO tomo · 2 of 52 frames shown]
[frame 17/52]
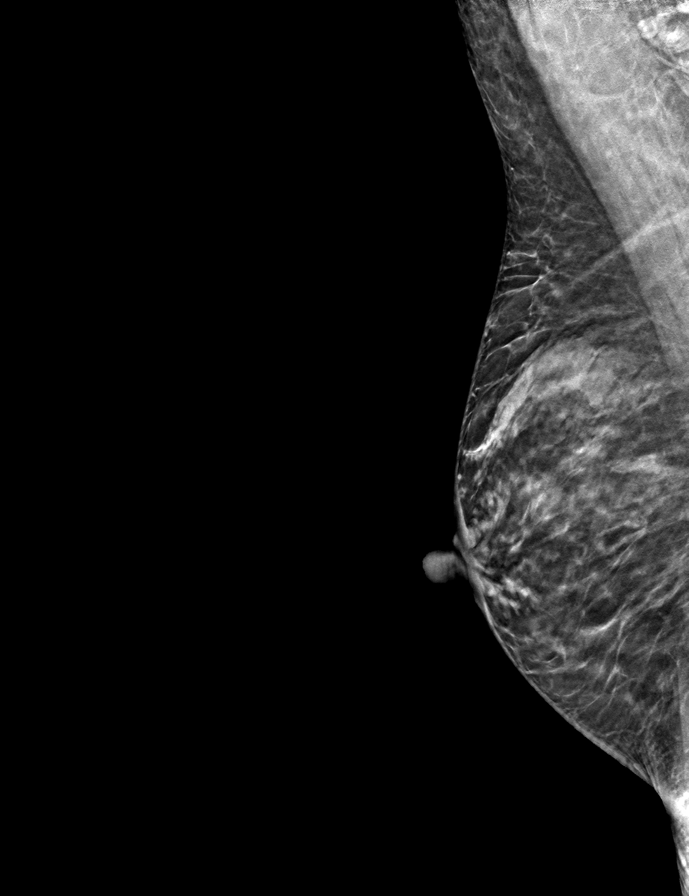
[frame 27/52]
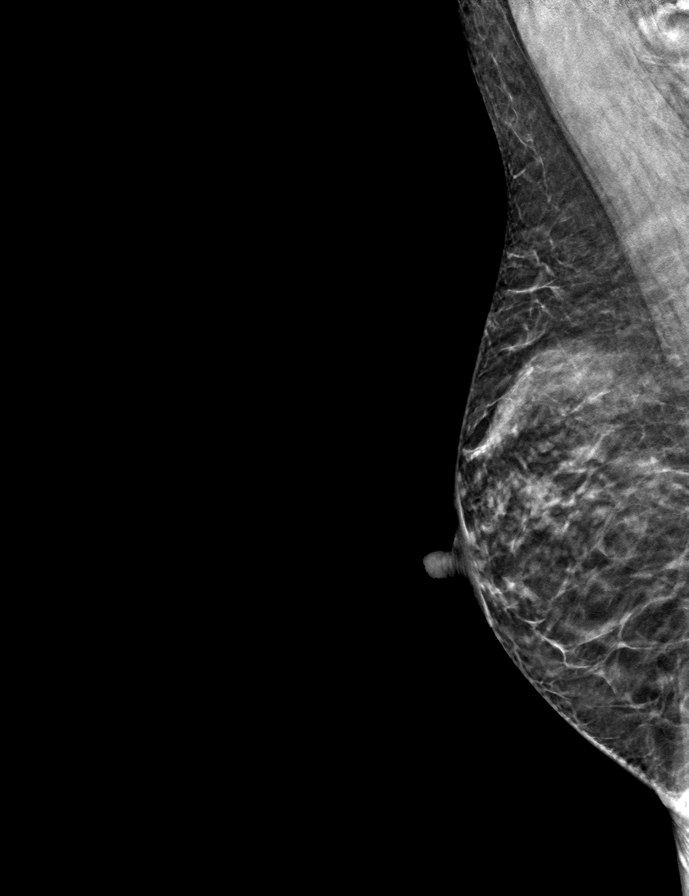

[R CC tomo · tomo slice 23/46.0]
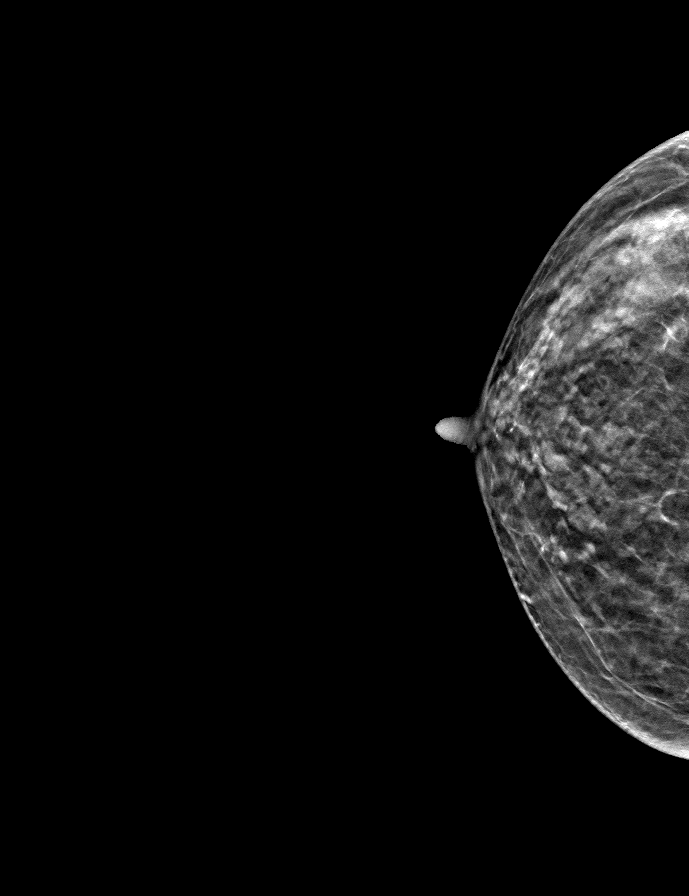

[L CC tomo · tomo slice 23/45.0]
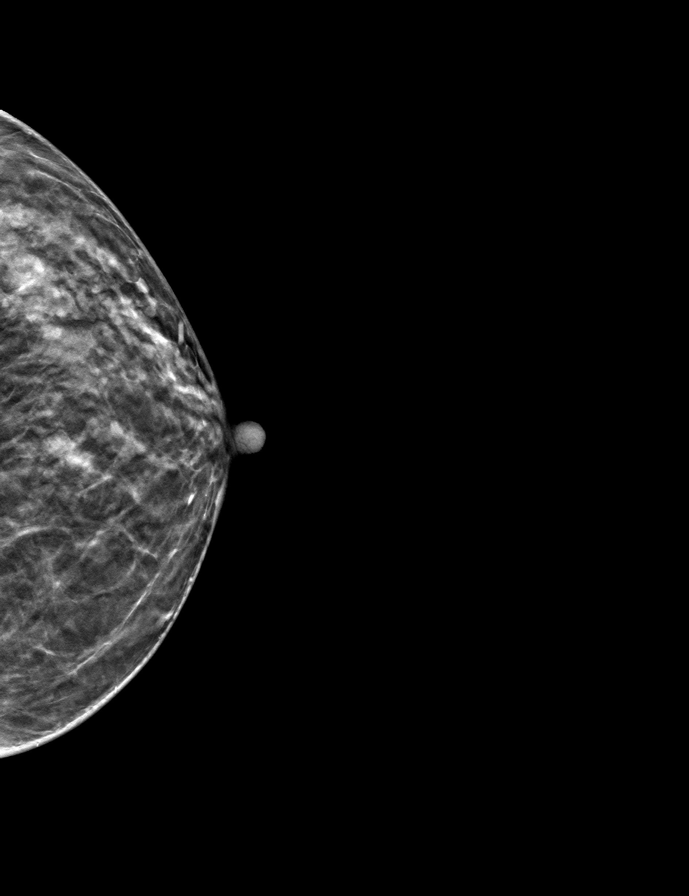

[L MLO tomo · tomo slice 27/53.0]
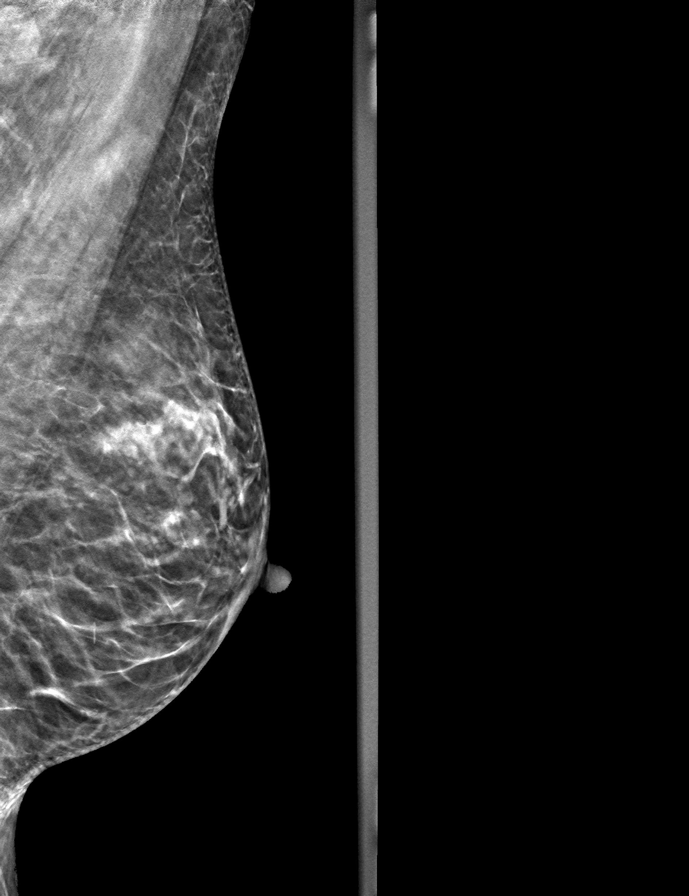

[9 of 24 positions shown; findings below may reference images not displayed]

ACR Breast Density Category c: The breast tissue is heterogeneously
dense, which may obscure small masses.
FINDINGS: There are no findings suspicious for malignancy.
IMPRESSION: No mammographic evidence of malignancy. A result letter of this
screening mammogram will be mailed directly to the patient.

RECOMMENDATION:
Screening mammogram in one year. (Code:Q3-W-BC3)

BI-RADS CATEGORY  1: Negative.

## 2022-08-22 DIAGNOSIS — I129 Hypertensive chronic kidney disease with stage 1 through stage 4 chronic kidney disease, or unspecified chronic kidney disease: Secondary | ICD-10-CM | POA: Diagnosis not present

## 2022-08-22 DIAGNOSIS — N184 Chronic kidney disease, stage 4 (severe): Secondary | ICD-10-CM | POA: Diagnosis not present

## 2022-08-22 DIAGNOSIS — E1122 Type 2 diabetes mellitus with diabetic chronic kidney disease: Secondary | ICD-10-CM | POA: Diagnosis not present

## 2022-08-27 DIAGNOSIS — I129 Hypertensive chronic kidney disease with stage 1 through stage 4 chronic kidney disease, or unspecified chronic kidney disease: Secondary | ICD-10-CM | POA: Diagnosis not present

## 2022-08-27 DIAGNOSIS — E559 Vitamin D deficiency, unspecified: Secondary | ICD-10-CM | POA: Diagnosis not present

## 2022-08-27 DIAGNOSIS — E875 Hyperkalemia: Secondary | ICD-10-CM | POA: Diagnosis not present

## 2022-08-27 DIAGNOSIS — E872 Acidosis, unspecified: Secondary | ICD-10-CM | POA: Diagnosis not present

## 2022-08-27 DIAGNOSIS — D631 Anemia in chronic kidney disease: Secondary | ICD-10-CM | POA: Diagnosis not present

## 2022-08-27 DIAGNOSIS — N184 Chronic kidney disease, stage 4 (severe): Secondary | ICD-10-CM | POA: Diagnosis not present

## 2022-08-27 DIAGNOSIS — M898X9 Other specified disorders of bone, unspecified site: Secondary | ICD-10-CM | POA: Diagnosis not present

## 2022-08-27 DIAGNOSIS — R801 Persistent proteinuria, unspecified: Secondary | ICD-10-CM | POA: Diagnosis not present

## 2022-08-27 DIAGNOSIS — E1122 Type 2 diabetes mellitus with diabetic chronic kidney disease: Secondary | ICD-10-CM | POA: Diagnosis not present

## 2022-08-27 DIAGNOSIS — Z7984 Long term (current) use of oral hypoglycemic drugs: Secondary | ICD-10-CM | POA: Diagnosis not present

## 2022-09-06 DIAGNOSIS — E113313 Type 2 diabetes mellitus with moderate nonproliferative diabetic retinopathy with macular edema, bilateral: Secondary | ICD-10-CM | POA: Diagnosis not present

## 2022-09-06 DIAGNOSIS — H35033 Hypertensive retinopathy, bilateral: Secondary | ICD-10-CM | POA: Diagnosis not present

## 2022-09-06 DIAGNOSIS — H3411 Central retinal artery occlusion, right eye: Secondary | ICD-10-CM | POA: Diagnosis not present

## 2022-09-06 DIAGNOSIS — Z961 Presence of intraocular lens: Secondary | ICD-10-CM | POA: Diagnosis not present

## 2022-09-06 DIAGNOSIS — H40052 Ocular hypertension, left eye: Secondary | ICD-10-CM | POA: Diagnosis not present

## 2022-09-20 DIAGNOSIS — D631 Anemia in chronic kidney disease: Secondary | ICD-10-CM | POA: Diagnosis not present

## 2022-09-20 DIAGNOSIS — N183 Chronic kidney disease, stage 3 unspecified: Secondary | ICD-10-CM | POA: Diagnosis not present

## 2022-09-26 DIAGNOSIS — N184 Chronic kidney disease, stage 4 (severe): Secondary | ICD-10-CM | POA: Diagnosis not present

## 2022-09-26 DIAGNOSIS — D631 Anemia in chronic kidney disease: Secondary | ICD-10-CM | POA: Diagnosis not present

## 2022-10-10 DIAGNOSIS — N184 Chronic kidney disease, stage 4 (severe): Secondary | ICD-10-CM | POA: Diagnosis not present

## 2022-10-10 DIAGNOSIS — D631 Anemia in chronic kidney disease: Secondary | ICD-10-CM | POA: Diagnosis not present

## 2022-10-24 DIAGNOSIS — N184 Chronic kidney disease, stage 4 (severe): Secondary | ICD-10-CM | POA: Diagnosis not present

## 2022-10-24 DIAGNOSIS — D631 Anemia in chronic kidney disease: Secondary | ICD-10-CM | POA: Diagnosis not present

## 2022-10-31 DIAGNOSIS — E1122 Type 2 diabetes mellitus with diabetic chronic kidney disease: Secondary | ICD-10-CM | POA: Diagnosis not present

## 2022-10-31 DIAGNOSIS — N184 Chronic kidney disease, stage 4 (severe): Secondary | ICD-10-CM | POA: Diagnosis not present

## 2022-10-31 DIAGNOSIS — I129 Hypertensive chronic kidney disease with stage 1 through stage 4 chronic kidney disease, or unspecified chronic kidney disease: Secondary | ICD-10-CM | POA: Diagnosis not present

## 2022-11-05 DIAGNOSIS — E113313 Type 2 diabetes mellitus with moderate nonproliferative diabetic retinopathy with macular edema, bilateral: Secondary | ICD-10-CM | POA: Diagnosis not present

## 2022-11-07 DIAGNOSIS — D631 Anemia in chronic kidney disease: Secondary | ICD-10-CM | POA: Diagnosis not present

## 2022-11-07 DIAGNOSIS — N184 Chronic kidney disease, stage 4 (severe): Secondary | ICD-10-CM | POA: Diagnosis not present

## 2022-11-25 DIAGNOSIS — D631 Anemia in chronic kidney disease: Secondary | ICD-10-CM | POA: Diagnosis not present

## 2022-11-25 DIAGNOSIS — N184 Chronic kidney disease, stage 4 (severe): Secondary | ICD-10-CM | POA: Diagnosis not present

## 2022-12-03 ENCOUNTER — Ambulatory Visit: Payer: Medicare HMO | Admitting: Dermatology

## 2022-12-03 ENCOUNTER — Encounter: Payer: Self-pay | Admitting: Dermatology

## 2022-12-03 VITALS — BP 134/69 | HR 60

## 2022-12-03 DIAGNOSIS — Z5111 Encounter for antineoplastic chemotherapy: Secondary | ICD-10-CM | POA: Diagnosis not present

## 2022-12-03 DIAGNOSIS — Z808 Family history of malignant neoplasm of other organs or systems: Secondary | ICD-10-CM | POA: Diagnosis not present

## 2022-12-03 DIAGNOSIS — D2239 Melanocytic nevi of other parts of face: Secondary | ICD-10-CM | POA: Diagnosis not present

## 2022-12-03 DIAGNOSIS — L570 Actinic keratosis: Secondary | ICD-10-CM | POA: Diagnosis not present

## 2022-12-03 DIAGNOSIS — W908XXA Exposure to other nonionizing radiation, initial encounter: Secondary | ICD-10-CM | POA: Diagnosis not present

## 2022-12-03 MED ORDER — FLUOROURACIL 5 % EX CREA: TOPICAL_CREAM | CUTANEOUS | 1 refills | Status: DC

## 2022-12-03 NOTE — Patient Instructions (Addendum)
Efudex Instructions 1. Apply a thin layer of Efudex cream to the specified sun  damaged area twice a day. The medication is to be applied to the entire  area, not just the keratoses (rough spots). Wash hands thoroughly after  application! 2. Use Efudex twice daily for 2 weeks to 4 weeks (depending on the  physician directions) or once a day for 4 weeks (depending on the  physician directions).  3. The skin will become red and scaly. This is the appropriate response.  4. Once redness starts, you may begin using Hydrocortisone 1% Cream (or  other topical steroid provided by the physician) twice a day. This helps  relieve pain and will speed the healing process. It may be applied as soon  as 30 minutes after Efudex application. 5. For excessive reactions or if suspicious for an infection or allergic  reaction, please call to make an appointment at my office so that I may  evaluate your response to treatment. 6. The area may be washed as you normally would with soap and water. Do  not wash the face for at least three hours after Efudex  application. 7. Ladies: Christine Strong is permitted throughout the entire treatment period. Wait  30 minutes after Efudex use before applying makeup directly on top of the  medication. 8. Please read the instructional pamphlet provided by the [employer] so that you may familiarize yourself with the kinds of reactions  you may experience. 9. If you have had a biopsy on any area that you will use the medication on,  DO NOT start the Efudex until your results are given to you and  the medical assistant lets you know it is okay to start the treatment   Important Information   Due to recent changes in healthcare laws, you may see results of your pathology and/or laboratory studies on MyChart before the doctors have had a chance to review them. We understand that in some cases there may be results that are confusing or concerning to you. Please understand that  not all results are received at the same time and often the doctors may need to interpret multiple results in order to provide you with the best plan of care or course of treatment. Therefore, we ask that you please give [place] 2 business days to thoroughly review all your results before contacting the office for clarification. Should we see a critical lab result, you will be contacted sooner.     If You Need Anything After Your Visit   If you have any questions or concerns for your doctor, please call our main line at [phone]. If no one answers, please leave a voicemail as directed and we will return your call as soon as possible. Messages left after 4 pm will be answered the following business day.    You may also send [place] a message via MyChart. We typically respond to MyChart messages within 1-2 business days.  For prescription refills, please ask your pharmacy to contact our office. Our fax number is [phone].  If you have an urgent issue when the clinic is closed that cannot wait until the next business day, you can page your doctor at the number below.     Please note that while we do our best to be available for urgent issues outside of office hours, we are not available 24/7.    If you have an urgent issue and are unable to reach [place], you may choose to seek medical  care at your doctor's office, retail clinic, urgent care center, or emergency room.   If you have a medical emergency, please immediately call 911 or go to the emergency department. In the event of inclement weather, please call our main line at [phone] for an update on the status of any delays or closures.  Dermatology Medication Tips: Please keep the boxes that topical medications come in in order to help keep track of the instructions about where and how to use these. Pharmacies typically print the medication instructions only on the boxes and not directly on the medication tubes.   If your medication is too  expensive, please contact our office at [phone] or send [place] a message through MyChart.    We are unable to tell what your co-pay for medications will be in advance as this is different depending on your insurance coverage. However, we may be able to find a substitute medication at lower cost or fill out paperwork to get insurance to cover a needed medication.    If a prior authorization is required to get your medication covered by your insurance company, please allow [place] 1-2 business days to complete this process.   Drug prices often vary depending on where the prescription is filled and some pharmacies may offer cheaper prices.   The website www.goodrx.com contains coupons for medications through different pharmacies. The prices here do not account for what the cost may be with help from insurance (it may be cheaper with your insurance), but the website can give you the price if you did not use any insurance.  - You can print the associated coupon and take it with your prescription to the pharmacy.  - You may also stop by our office during regular business hours and pick up a GoodRx coupon card.  - If you need your prescription sent electronically to a different pharmacy, notify our office through [facility] or by phone at [phone]

## 2022-12-03 NOTE — Progress Notes (Signed)
   New Patient Visit   Subjective  Christine Strong is a 72 y.o. female who presents for the following: growths on face. Pt has growth on nose and cheek she would like evaluated. She has no hx of skin cancer but family hx of NMSC  The following portions of the chart were reviewed this encounter and updated as appropriate: medications, allergies, medical history  Review of Systems:  No other skin or systemic complaints except as noted in HPI or Assessment and Plan.  Objective  Well appearing patient in no apparent distress; mood and affect are within normal limits.  A focused examination was performed of the following areas: Nose and right cheek  Relevant exam findings are noted in the Assessment and Plan.    Assessment & Plan   Angiofibroma/Fibrous Papule tip of nose - 1-2 mm smooth symmetric flesh colored to pink papule(s) without features suspicious for malignancy on dermoscopy - Benign-appearing.  Observation.  Call clinic for new or changing lesions.    ACTINIC KERATOSIS Exam: Erythematous thin papules/macules with gritty scale on the nasal dorsum and bilateral cheek, R>L  Actinic keratoses are precancerous spots that appear secondary to cumulative UV radiation exposure/sun exposure over time. They are chronic with expected duration over 1 year. A portion of actinic keratoses will progress to squamous cell carcinoma of the skin. It is not possible to reliably predict which spots will progress to skin cancer and so treatment is recommended to prevent development of skin cancer.  Recommend daily broad spectrum sunscreen SPF 30+ to sun-exposed areas, reapply every 2 hours as needed.  Recommend staying in the shade or wearing long sleeves, sun glasses (UVA+UVB protection) and wide brim hats (4-inch brim around the entire circumference of the hat). Call for new or changing lesions.  Treatment Plan: Start 5-fluorouracil cream twice a day for 2 weeks days to affected areas including  nose and cheeks.  Reviewed course of treatment and expected reaction.  Patient advised to expect inflammation and crusting and advised that erosions are possible.  Patient advised to be diligent with sun protection during and after treatment. Handout with details of how to apply medication and what to expect provided. Counseled to keep medication out of reach of children and pets.  Reviewed course of treatment and expected reaction.  Patient advised to expect inflammation and crusting and advised that erosions are possible.  Patient advised to be diligent with sun protection during and after treatment. Handout with details of how to apply medication and what to expect provided. Counseled to keep medication out of reach of children and pets.    Return in about 4 weeks (around 12/31/2022) for ak f/u after efudex .  I, Tillie Fantasia, CMA, am acting as scribe for Gwenith Daily, MD.   Documentation: I have reviewed the above documentation for accuracy and completeness, and I agree with the above.  Gwenith Daily, MD

## 2022-12-04 DIAGNOSIS — Z0000 Encounter for general adult medical examination without abnormal findings: Secondary | ICD-10-CM | POA: Diagnosis not present

## 2022-12-04 DIAGNOSIS — Z1331 Encounter for screening for depression: Secondary | ICD-10-CM | POA: Diagnosis not present

## 2022-12-04 DIAGNOSIS — Z6824 Body mass index (BMI) 24.0-24.9, adult: Secondary | ICD-10-CM | POA: Diagnosis not present

## 2022-12-09 DIAGNOSIS — D631 Anemia in chronic kidney disease: Secondary | ICD-10-CM | POA: Diagnosis not present

## 2022-12-09 DIAGNOSIS — N184 Chronic kidney disease, stage 4 (severe): Secondary | ICD-10-CM | POA: Diagnosis not present

## 2022-12-17 DIAGNOSIS — D631 Anemia in chronic kidney disease: Secondary | ICD-10-CM | POA: Diagnosis not present

## 2022-12-17 DIAGNOSIS — N184 Chronic kidney disease, stage 4 (severe): Secondary | ICD-10-CM | POA: Diagnosis not present

## 2022-12-23 DIAGNOSIS — N184 Chronic kidney disease, stage 4 (severe): Secondary | ICD-10-CM | POA: Diagnosis not present

## 2022-12-23 DIAGNOSIS — D631 Anemia in chronic kidney disease: Secondary | ICD-10-CM | POA: Diagnosis not present

## 2023-01-03 ENCOUNTER — Ambulatory Visit: Payer: Medicare HMO | Admitting: Dermatology

## 2023-01-03 ENCOUNTER — Encounter: Payer: Self-pay | Admitting: Dermatology

## 2023-01-03 VITALS — BP 123/64 | HR 59

## 2023-01-03 DIAGNOSIS — L570 Actinic keratosis: Secondary | ICD-10-CM

## 2023-01-03 DIAGNOSIS — Z5111 Encounter for antineoplastic chemotherapy: Secondary | ICD-10-CM

## 2023-01-03 DIAGNOSIS — W908XXA Exposure to other nonionizing radiation, initial encounter: Secondary | ICD-10-CM | POA: Diagnosis not present

## 2023-01-03 NOTE — Patient Instructions (Signed)

## 2023-01-03 NOTE — Progress Notes (Signed)
   Follow-Up Visit   Subjective  Christine Strong is a 72 y.o. female who presents for the following: AKs on the face. Pt here to f/u from treatment of AKs on the face. Pt used efudex for 2 weeks and had a brisk reaction. She finished around 1 week ago. She still has some redness and peeling but she is pleased with the reaction. Last visit was on 12/03/2022.  The following portions of the chart were reviewed this encounter and updated as appropriate: medications, allergies, medical history  Review of Systems:  No other skin or systemic complaints except as noted in HPI or Assessment and Plan.  Objective  Well appearing patient in no apparent distress; mood and affect are within normal limits.  A focused examination was performed of the following areas: face  Relevant exam findings are noted in the Assessment and Plan.    Assessment & Plan    ACTINIC KERATOSIS- improved s/p topical 5FU Exam: Erythematous thin papules/macules with gritty scale at the nose and right cheek  Actinic keratoses are precancerous spots that appear secondary to cumulative UV radiation exposure/sun exposure over time. They are chronic with expected duration over 1 year. A portion of actinic keratoses will progress to squamous cell carcinoma of the skin. It is not possible to reliably predict which spots will progress to skin cancer and so treatment is recommended to prevent development of skin cancer.  Recommend daily broad spectrum sunscreen SPF 30+ to sun-exposed areas, reapply every 2 hours as needed.  Recommend staying in the shade or wearing long sleeves, sun glasses (UVA+UVB protection) and wide brim hats (4-inch brim around the entire circumference of the hat). Call for new or changing lesions.  Treatment Plan: Continue topical 5FU to right cheek for another 1-2 weeks Will wait to reassess nose once the inflammation has subsided   Patient responded well to treatment but has a new spot to apply the  cream to for two weeks then follow up in feb. ACTINIC KERATOSES   Related Medications fluorouracil (EFUDEX) 5 % cream Apply to nose and cheeks twice a day for 2 weeks  Return in about 2 months (around 03/06/2023) for ak f/u .  I, Tillie Fantasia, CMA, am acting as scribe for Gwenith Daily, MD.   Documentation: I have reviewed the above documentation for accuracy and completeness, and I agree with the above.  Gwenith Daily, MD

## 2023-03-06 ENCOUNTER — Ambulatory Visit: Payer: Medicare HMO | Admitting: Dermatology

## 2023-03-11 DIAGNOSIS — H35033 Hypertensive retinopathy, bilateral: Secondary | ICD-10-CM | POA: Diagnosis not present

## 2023-03-11 DIAGNOSIS — E113313 Type 2 diabetes mellitus with moderate nonproliferative diabetic retinopathy with macular edema, bilateral: Secondary | ICD-10-CM | POA: Diagnosis not present

## 2023-03-11 DIAGNOSIS — H35353 Cystoid macular degeneration, bilateral: Secondary | ICD-10-CM | POA: Diagnosis not present

## 2023-04-07 DIAGNOSIS — N184 Chronic kidney disease, stage 4 (severe): Secondary | ICD-10-CM | POA: Diagnosis not present

## 2023-04-07 DIAGNOSIS — E559 Vitamin D deficiency, unspecified: Secondary | ICD-10-CM | POA: Diagnosis not present

## 2023-04-07 DIAGNOSIS — D631 Anemia in chronic kidney disease: Secondary | ICD-10-CM | POA: Diagnosis not present

## 2023-04-10 DIAGNOSIS — N185 Chronic kidney disease, stage 5: Secondary | ICD-10-CM | POA: Diagnosis not present

## 2023-04-10 DIAGNOSIS — E872 Acidosis, unspecified: Secondary | ICD-10-CM | POA: Diagnosis not present

## 2023-04-10 DIAGNOSIS — R801 Persistent proteinuria, unspecified: Secondary | ICD-10-CM | POA: Diagnosis not present

## 2023-04-10 DIAGNOSIS — D631 Anemia in chronic kidney disease: Secondary | ICD-10-CM | POA: Diagnosis not present

## 2023-04-10 DIAGNOSIS — E1122 Type 2 diabetes mellitus with diabetic chronic kidney disease: Secondary | ICD-10-CM | POA: Diagnosis not present

## 2023-04-10 DIAGNOSIS — I120 Hypertensive chronic kidney disease with stage 5 chronic kidney disease or end stage renal disease: Secondary | ICD-10-CM | POA: Diagnosis not present

## 2023-04-10 DIAGNOSIS — E559 Vitamin D deficiency, unspecified: Secondary | ICD-10-CM | POA: Diagnosis not present

## 2023-04-10 DIAGNOSIS — M898X9 Other specified disorders of bone, unspecified site: Secondary | ICD-10-CM | POA: Diagnosis not present

## 2023-04-15 DIAGNOSIS — D631 Anemia in chronic kidney disease: Secondary | ICD-10-CM | POA: Diagnosis not present

## 2023-04-15 DIAGNOSIS — N185 Chronic kidney disease, stage 5: Secondary | ICD-10-CM | POA: Diagnosis not present

## 2023-04-29 DIAGNOSIS — D631 Anemia in chronic kidney disease: Secondary | ICD-10-CM | POA: Diagnosis not present

## 2023-04-29 DIAGNOSIS — N185 Chronic kidney disease, stage 5: Secondary | ICD-10-CM | POA: Diagnosis not present

## 2023-05-22 DIAGNOSIS — N185 Chronic kidney disease, stage 5: Secondary | ICD-10-CM | POA: Diagnosis not present

## 2023-05-22 DIAGNOSIS — D631 Anemia in chronic kidney disease: Secondary | ICD-10-CM | POA: Diagnosis not present

## 2023-05-27 DIAGNOSIS — N185 Chronic kidney disease, stage 5: Secondary | ICD-10-CM | POA: Diagnosis not present

## 2023-05-27 DIAGNOSIS — D631 Anemia in chronic kidney disease: Secondary | ICD-10-CM | POA: Diagnosis not present

## 2023-06-03 ENCOUNTER — Other Ambulatory Visit: Payer: Self-pay | Admitting: Family Medicine

## 2023-06-03 DIAGNOSIS — Z1231 Encounter for screening mammogram for malignant neoplasm of breast: Secondary | ICD-10-CM

## 2023-06-24 DIAGNOSIS — E113313 Type 2 diabetes mellitus with moderate nonproliferative diabetic retinopathy with macular edema, bilateral: Secondary | ICD-10-CM | POA: Diagnosis not present

## 2023-06-24 DIAGNOSIS — H35033 Hypertensive retinopathy, bilateral: Secondary | ICD-10-CM | POA: Diagnosis not present

## 2023-06-24 DIAGNOSIS — H35353 Cystoid macular degeneration, bilateral: Secondary | ICD-10-CM | POA: Diagnosis not present

## 2023-07-29 DIAGNOSIS — E1122 Type 2 diabetes mellitus with diabetic chronic kidney disease: Secondary | ICD-10-CM | POA: Diagnosis not present

## 2023-07-29 DIAGNOSIS — D631 Anemia in chronic kidney disease: Secondary | ICD-10-CM | POA: Diagnosis not present

## 2023-07-29 DIAGNOSIS — I120 Hypertensive chronic kidney disease with stage 5 chronic kidney disease or end stage renal disease: Secondary | ICD-10-CM | POA: Diagnosis not present

## 2023-07-29 DIAGNOSIS — N185 Chronic kidney disease, stage 5: Secondary | ICD-10-CM | POA: Diagnosis not present

## 2023-07-30 ENCOUNTER — Ambulatory Visit
Admission: RE | Admit: 2023-07-30 | Discharge: 2023-07-30 | Disposition: A | Discharge status: Home / Self Care | Source: Ambulatory Visit | Attending: Family Medicine | Admitting: Family Medicine

## 2023-07-30 DIAGNOSIS — Z1231 Encounter for screening mammogram for malignant neoplasm of breast: Secondary | ICD-10-CM | POA: Diagnosis not present

## 2023-08-05 DIAGNOSIS — E872 Acidosis, unspecified: Secondary | ICD-10-CM | POA: Diagnosis not present

## 2023-08-05 DIAGNOSIS — I120 Hypertensive chronic kidney disease with stage 5 chronic kidney disease or end stage renal disease: Secondary | ICD-10-CM | POA: Diagnosis not present

## 2023-08-05 DIAGNOSIS — E1122 Type 2 diabetes mellitus with diabetic chronic kidney disease: Secondary | ICD-10-CM | POA: Diagnosis not present

## 2023-08-05 DIAGNOSIS — E559 Vitamin D deficiency, unspecified: Secondary | ICD-10-CM | POA: Diagnosis not present

## 2023-08-05 DIAGNOSIS — D631 Anemia in chronic kidney disease: Secondary | ICD-10-CM | POA: Diagnosis not present

## 2023-08-05 DIAGNOSIS — R801 Persistent proteinuria, unspecified: Secondary | ICD-10-CM | POA: Diagnosis not present

## 2023-08-05 DIAGNOSIS — M898X9 Other specified disorders of bone, unspecified site: Secondary | ICD-10-CM | POA: Diagnosis not present

## 2023-08-05 DIAGNOSIS — N185 Chronic kidney disease, stage 5: Secondary | ICD-10-CM | POA: Diagnosis not present

## 2023-08-05 DIAGNOSIS — E875 Hyperkalemia: Secondary | ICD-10-CM | POA: Diagnosis not present

## 2023-08-13 ENCOUNTER — Telehealth: Payer: Self-pay | Admitting: Gastroenterology

## 2023-08-13 NOTE — Telephone Encounter (Signed)
 Patient requesting to be seen for a colonoscopy. States she had one 1 year and a half ago with [facility]. States she did not like the set up there and no longer happy with care. No specific provider requested. Patient will have records sent.

## 2023-09-03 ENCOUNTER — Telehealth: Payer: Self-pay | Admitting: Gastroenterology

## 2023-09-03 NOTE — Telephone Encounter (Signed)
 Good morning Dr. San,   DOD AM 8/27   Patient is requesting to be seen for a colonoscopy. Patient last had a colonoscopy in 2020 with [facility]. Patient states she did not like the set up there and no longer happy with care. Patient's previous records are in Epic media for you to review and advise on scheduling.   Thank you

## 2023-09-12 ENCOUNTER — Encounter: Payer: Self-pay | Admitting: Gastroenterology

## 2023-10-14 DIAGNOSIS — E113392 Type 2 diabetes mellitus with moderate nonproliferative diabetic retinopathy without macular edema, left eye: Secondary | ICD-10-CM | POA: Diagnosis not present

## 2023-10-30 DIAGNOSIS — E1122 Type 2 diabetes mellitus with diabetic chronic kidney disease: Secondary | ICD-10-CM | POA: Diagnosis not present

## 2023-10-30 DIAGNOSIS — N185 Chronic kidney disease, stage 5: Secondary | ICD-10-CM | POA: Diagnosis not present

## 2023-10-30 DIAGNOSIS — I120 Hypertensive chronic kidney disease with stage 5 chronic kidney disease or end stage renal disease: Secondary | ICD-10-CM | POA: Diagnosis not present

## 2023-11-04 ENCOUNTER — Encounter: Payer: Self-pay | Admitting: Gastroenterology

## 2023-11-04 ENCOUNTER — Ambulatory Visit: Admitting: Gastroenterology

## 2023-11-04 VITALS — BP 94/52 | HR 65 | Ht 65.0 in | Wt 138.4 lb

## 2023-11-04 DIAGNOSIS — Z860100 Personal history of colon polyps, unspecified: Secondary | ICD-10-CM

## 2023-11-04 MED ORDER — NA SULFATE-K SULFATE-MG SULF 17.5-3.13-1.6 GM/177ML PO SOLN: 1.0000 | Freq: Once | ORAL | 0 refills | Status: AC

## 2023-11-04 NOTE — Patient Instructions (Signed)
 We have sent the following medications to your pharmacy for you to pick up at your convenience: SUPREP  You have been scheduled for a colonoscopy. Please follow written instructions given to you at your visit today.   If you use inhalers (even only as needed), please bring them with you on the day of your procedure.  DO NOT TAKE 7 DAYS PRIOR TO TEST- Trulicity (dulaglutide) Ozempic, Wegovy (semaglutide) Mounjaro (tirzepatide) Bydureon Bcise (exanatide extended release)  DO NOT TAKE 1 DAY PRIOR TO YOUR TEST Rybelsus (semaglutide) Adlyxin (lixisenatide) Victoza (liraglutide) Byetta (exanatide) ___________________________________________________________________________  Due to recent changes in healthcare laws, you may see the results of your imaging and laboratory studies on MyChart before your provider has had a chance to review them.  We understand that in some cases there may be results that are confusing or concerning to you. Not all laboratory results come back in the same time frame and the provider may be waiting for multiple results in order to interpret others.  Please give us  48 hours in order for your provider to thoroughly review all the results before contacting the office for clarification of your results.   _______________________________________________________  If your blood pressure at your visit was 140/90 or greater, please contact your primary care physician to follow up on this.  _______________________________________________________  If you are age 80 or older, your body mass index should be between 23-30. Your Body mass index is 23.03 kg/m. If this is out of the aforementioned range listed, please consider follow up with your Primary Care Provider.  If you are age 55 or younger, your body mass index should be between 19-25. Your Body mass index is 23.03 kg/m. If this is out of the aformentioned range listed, please consider follow up with your Primary Care  Provider.   ________________________________________________________  The [facility] providers would like to encourage you to use MYCHART to communicate with providers for non-urgent requests or questions.  Due to long hold times on the telephone, sending your provider a message by [facility] may be a faster and more efficient way to get a response.  Please allow 48 business hours for a response.  Please remember that this is for non-urgent requests.  _______________________________________________________  [facility] is using a team-based approach to care.  Your team is made up of your doctor and two to three APPS. Our APPS (Nurse Practitioners and Physician Assistants) work with your physician to ensure care continuity for you. They are fully qualified to address your health concerns and develop a treatment plan. They communicate directly with your gastroenterologist to care for you. Seeing the Advanced Practice Practitioners on your physician's team can help you by facilitating care more promptly, often allowing for earlier appointments, access to diagnostic testing, procedures, and other specialty referrals.   Thank you for trusting me with your gastrointestinal care. Deanna May, FNP-C

## 2023-11-04 NOTE — Progress Notes (Signed)
 Chief Complaint:colonoscopy Primary GI Doctor: Dr. San  HPI:  Patient is a  73  year old female patient with past medical history of DM, HTN, and hyperlipidemia, who was self referred to me for a evaluation of colon screening .    Interval History  Patient presents for evaluation for colon screening colonoscopy for history of colonic polyps.  Patient has history of GERD, manages with dietary modifications. No dysphagia. Patient denies nausea, vomiting, or weight loss.  Patient denies altered bowel habits, abdominal pain, or rectal bleeding.  She drinks 1 glass of wine weekly. Nonsmoker.   No NSAID's.  Patient's family history includes: father with lung CA (smoker)  Wt Readings from Last 3 Encounters:  11/04/23 138 lb 6 oz (62.8 kg)  04/12/19 138 lb 12.8 oz (63 kg)  02/02/19 137 lb 3.2 oz (62.2 kg)    Past Medical History:  Diagnosis Date   Diabetes mellitus with neuropathy (HCC)    On metformin    Hyperlipidemia    On atorvastatin 20 mg   Hypertension    On nifedipine 60 mg along with Lopressor 12.5 twice daily   Mild aortic stenosis by prior echocardiogram 02/2018   Moderate aortic valve thickening-mild stenosis (mean gradient 9.3 mmHg)   Past Surgical History:  Procedure Laterality Date   CATARACT EXTRACTION Left 12/19/2017   TRANSTHORACIC ECHOCARDIOGRAM  03/06/2018   Mildly increased LV thickness.  EF 60-65%.  Now RWMA (read as restrictive filling pattern--on my review this is clearly incorrect, is more grade 1 diastolic dysfunction).  Mild LA dilation.  Moderate mitral valve thickening and calcification but no stenosis or regurgitation.  Moderate aortic valve thickening-mild aortic stenosis.    Current Outpatient Medications  Medication Sig Dispense Refill   atorvastatin (LIPITOR) 80 MG tablet Take by mouth.     brimonidine (ALPHAGAN) 0.2 % ophthalmic solution      cloNIDine  (CATAPRES ) 0.1 MG tablet Take 0.1 mg by mouth 2 (two) times daily.      Cyanocobalamin  (VITAMIN B 12 PO) Take 2 tablets by mouth daily.     dorzolamide (TRUSOPT) 2 % ophthalmic solution      ergocalciferol (VITAMIN D2) 1.25 MG (50000 UT) capsule Take by mouth.     hydrALAZINE (APRESOLINE) 100 MG tablet Take 100 mg by mouth 3 (three) times daily.     JANUVIA 25 MG tablet Take 25 mg by mouth daily.     lisinopril  (ZESTRIL ) 40 MG tablet      Melatonin 3 MG TABS 3 mg nightly.     metoprolol tartrate (LOPRESSOR) 25 MG tablet TAKE ONE TABLET BY MOUTH TWICE A DAY (REPLACES LABETALOL)     NIFEdipine (PROCARDIA XL/NIFEDICAL XL) 60 MG 24 hr tablet Take by mouth.     pantoprazole (PROTONIX) 40 MG tablet 40 mg daily.      POLY-IRON  150 150 MG capsule TAKE ONE CAPSULE BY MOUTH DAILY 30 capsule 1   sodium bicarbonate 650 MG tablet Take 650 mg by mouth.     timolol (TIMOPTIC) 0.5 % ophthalmic solution      traZODone (DESYREL) 50 MG tablet Take 50 mg by mouth at bedtime.     No current facility-administered medications for this visit.    Allergies as of 11/04/2023   (No Known Allergies)    Family History  Problem Relation Age of Onset   Diabetes Mellitus II Mother    Hypertension Mother    Diabetes Mellitus II Father    Diabetes Mellitus II Brother  Review of Systems:    Constitutional: No weight loss, fever, chills, weakness or fatigue HEENT: Eyes: No change in vision               Ears, Nose, Throat:  No change in hearing or congestion Skin: No rash or itching Cardiovascular: No chest pain, chest pressure or palpitations   Respiratory: No SOB or cough Gastrointestinal: See HPI and otherwise negative Genitourinary: No dysuria or change in urinary frequency Neurological: No headache, dizziness or syncope Musculoskeletal: No new muscle or joint pain Hematologic: No bleeding or bruising Psychiatric: No history of depression or anxiety    Physical Exam:  Vital signs: BP (!) 94/52   Pulse 65   Ht 5' 5 (1.651 m)   Wt 138 lb 6 oz (62.8 kg)   SpO2 97%    BMI 23.03 kg/m   Constitutional:   Pleasant  female appears to be in NAD, Well developed, Well nourished, alert and cooperative Throat: Oral cavity and pharynx without inflammation, swelling or lesion.  Respiratory: Respirations even and unlabored. Lungs clear to auscultation bilaterally.   No wheezes, crackles, or rhonchi.  Cardiovascular: Normal S1, S2. Regular rate and rhythm. No peripheral edema, cyanosis or pallor.  Gastrointestinal:  Soft, nondistended, nontender. No rebound or guarding. Normal bowel sounds. No appreciable masses or hepatomegaly. Rectal:  Not performed.  Msk:  Symmetrical without gross deformities. Without edema, no deformity or joint abnormality.  Neurologic:  Alert and  oriented x4;  grossly normal neurologically.  Skin:   Dry and intact without significant lesions or rashes.  RELEVANT LABS AND IMAGING: CBC    Latest Ref Rng & Units 05/03/2019    9:55 AM 04/12/2019    8:08 AM 02/02/2019    2:32 PM  CBC  WBC 4.0 - 10.5 K/uL 5.6  5.9  6.0   Hemoglobin 12.0 - 15.0 g/dL 9.1  8.6  8.5   Hematocrit 36.0 - 46.0 % 27.9  26.5  25.0   Platelets 150 - 400 K/uL 343  316  317      CMP     Latest Ref Rng & Units 04/12/2019    8:08 AM 09/21/2018   12:13 PM 12/05/2013    1:59 PM  CMP  Glucose 70 - 99 mg/dL 834  899  882   BUN 8 - 23 mg/dL 50  53  21   Creatinine 0.44 - 1.00 mg/dL 8.37  8.31  9.42   Sodium 135 - 145 mmol/L 141  139  140   Potassium 3.5 - 5.1 mmol/L 4.6  4.9  4.9   Chloride 98 - 111 mmol/L 109  107  102   CO2 22 - 32 mmol/L 21  22  24    Calcium 8.9 - 10.3 mg/dL 9.1  9.5  9.7   Total Protein 6.5 - 8.1 g/dL 7.0  7.6    Total Bilirubin 0.3 - 1.2 mg/dL 0.4  0.4    Alkaline Phos 38 - 126 U/L 46  49    AST 15 - 41 U/L 11  13    ALT 0 - 44 U/L 12  9       Lab Results  Component Value Date   TSH 0.506 09/21/2018    GI procedures: 03/25/2018: EGD (Dr. Elicia): Normal esophagus, mild gastritis (path: H. pylori positive), normal duodenum (path negative  for Celiac Disease). - 03/25/2018: Colonoscopy (Dr. Elicia): 4 mm descending colon polyp, 8 mm rectal polyp (path: Adenomas x 2), normal TI, external hemorrhoids.  Random colonic biopsies negative for Microscopic Colitis.  Repeat colonoscopy in 5 years.   02/2018 echo- The left ventricle has normal systolic function with an ejection  fraction of 60-65%.   Assessment: Encounter Diagnosis  Name Primary?   History of colonic polyps Yes  73 year old female patient with history of colonic polyps, last colonoscopy March 2020 patient due for colon screening colonoscopy will go ahead and schedule in [facility] with Dr. San.  Patient is not currently having any GI issues.  Plan: -Schedule for a colonoscopy in [facility] with Dr. San. The risks and benefits of colonoscopy with possible polypectomy / biopsies were discussed and the patient agrees to proceed.     Thank you for the courtesy of this consult. Please call me with any questions or concerns.   Vishnu Moeller, FNP-C  Gastroenterology 11/04/2023, 11:47 AM  Cc: Loretha Richerd SAUNDERS, MD

## 2023-11-10 DIAGNOSIS — I728 Aneurysm of other specified arteries: Secondary | ICD-10-CM | POA: Diagnosis not present

## 2023-11-12 NOTE — Progress Notes (Signed)
 Agree with the assessment and plan as outlined by [facility], FNP-C.  Carlitos Bottino, DO, [facility]

## 2023-11-13 DIAGNOSIS — Z949 Transplanted organ and tissue status, unspecified: Secondary | ICD-10-CM | POA: Diagnosis not present

## 2023-11-13 DIAGNOSIS — N186 End stage renal disease: Secondary | ICD-10-CM | POA: Diagnosis not present

## 2023-11-13 DIAGNOSIS — I120 Hypertensive chronic kidney disease with stage 5 chronic kidney disease or end stage renal disease: Secondary | ICD-10-CM | POA: Diagnosis not present

## 2023-11-13 DIAGNOSIS — E1122 Type 2 diabetes mellitus with diabetic chronic kidney disease: Secondary | ICD-10-CM | POA: Diagnosis not present

## 2023-11-13 DIAGNOSIS — J810 Acute pulmonary edema: Secondary | ICD-10-CM | POA: Diagnosis not present

## 2023-11-14 DIAGNOSIS — Z9483 Pancreas transplant status: Secondary | ICD-10-CM | POA: Diagnosis not present

## 2023-11-14 DIAGNOSIS — Z7682 Awaiting organ transplant status: Secondary | ICD-10-CM | POA: Diagnosis not present

## 2023-11-14 DIAGNOSIS — J9811 Atelectasis: Secondary | ICD-10-CM | POA: Diagnosis not present

## 2023-11-14 DIAGNOSIS — Z940 Kidney transplant status: Secondary | ICD-10-CM | POA: Diagnosis not present

## 2023-11-14 DIAGNOSIS — Z5181 Encounter for therapeutic drug level monitoring: Secondary | ICD-10-CM | POA: Diagnosis not present

## 2023-11-14 DIAGNOSIS — I440 Atrioventricular block, first degree: Secondary | ICD-10-CM | POA: Diagnosis not present

## 2023-11-14 DIAGNOSIS — Z79621 Long term (current) use of calcineurin inhibitor: Secondary | ICD-10-CM | POA: Diagnosis not present

## 2023-11-14 DIAGNOSIS — I120 Hypertensive chronic kidney disease with stage 5 chronic kidney disease or end stage renal disease: Secondary | ICD-10-CM | POA: Diagnosis not present

## 2023-11-14 DIAGNOSIS — Z79899 Other long term (current) drug therapy: Secondary | ICD-10-CM | POA: Diagnosis not present

## 2023-11-15 DIAGNOSIS — Z7682 Awaiting organ transplant status: Secondary | ICD-10-CM | POA: Diagnosis not present

## 2023-11-15 DIAGNOSIS — Z79899 Other long term (current) drug therapy: Secondary | ICD-10-CM | POA: Diagnosis not present

## 2023-11-15 DIAGNOSIS — Z79621 Long term (current) use of calcineurin inhibitor: Secondary | ICD-10-CM | POA: Diagnosis not present

## 2023-11-15 DIAGNOSIS — D649 Anemia, unspecified: Secondary | ICD-10-CM | POA: Diagnosis not present

## 2023-11-15 DIAGNOSIS — D84821 Immunodeficiency due to drugs: Secondary | ICD-10-CM | POA: Diagnosis not present

## 2023-11-15 DIAGNOSIS — I120 Hypertensive chronic kidney disease with stage 5 chronic kidney disease or end stage renal disease: Secondary | ICD-10-CM | POA: Diagnosis not present

## 2023-11-15 DIAGNOSIS — I10 Essential (primary) hypertension: Secondary | ICD-10-CM | POA: Diagnosis not present

## 2023-11-15 DIAGNOSIS — Z5181 Encounter for therapeutic drug level monitoring: Secondary | ICD-10-CM | POA: Diagnosis not present

## 2023-11-16 DIAGNOSIS — I119 Hypertensive heart disease without heart failure: Secondary | ICD-10-CM | POA: Diagnosis not present

## 2023-11-16 DIAGNOSIS — Z7682 Awaiting organ transplant status: Secondary | ICD-10-CM | POA: Diagnosis not present

## 2023-11-16 DIAGNOSIS — I120 Hypertensive chronic kidney disease with stage 5 chronic kidney disease or end stage renal disease: Secondary | ICD-10-CM | POA: Diagnosis not present

## 2023-11-16 DIAGNOSIS — Z79899 Other long term (current) drug therapy: Secondary | ICD-10-CM | POA: Diagnosis not present

## 2023-11-16 DIAGNOSIS — Z940 Kidney transplant status: Secondary | ICD-10-CM | POA: Diagnosis not present

## 2023-11-16 DIAGNOSIS — D649 Anemia, unspecified: Secondary | ICD-10-CM | POA: Diagnosis not present

## 2023-11-16 DIAGNOSIS — Z5181 Encounter for therapeutic drug level monitoring: Secondary | ICD-10-CM | POA: Diagnosis not present

## 2023-11-16 DIAGNOSIS — D84821 Immunodeficiency due to drugs: Secondary | ICD-10-CM | POA: Diagnosis not present

## 2023-11-16 DIAGNOSIS — I10 Essential (primary) hypertension: Secondary | ICD-10-CM | POA: Diagnosis not present

## 2023-11-16 DIAGNOSIS — Z79621 Long term (current) use of calcineurin inhibitor: Secondary | ICD-10-CM | POA: Diagnosis not present

## 2023-11-17 DIAGNOSIS — Z940 Kidney transplant status: Secondary | ICD-10-CM | POA: Diagnosis not present

## 2023-11-17 DIAGNOSIS — Z79621 Long term (current) use of calcineurin inhibitor: Secondary | ICD-10-CM | POA: Diagnosis not present

## 2023-11-17 DIAGNOSIS — Z4822 Encounter for aftercare following kidney transplant: Secondary | ICD-10-CM | POA: Diagnosis not present

## 2023-11-17 DIAGNOSIS — I10 Essential (primary) hypertension: Secondary | ICD-10-CM | POA: Diagnosis not present

## 2023-11-17 DIAGNOSIS — Z949 Transplanted organ and tissue status, unspecified: Secondary | ICD-10-CM | POA: Diagnosis not present

## 2023-11-17 DIAGNOSIS — D509 Iron deficiency anemia, unspecified: Secondary | ICD-10-CM | POA: Diagnosis not present

## 2023-11-17 DIAGNOSIS — N179 Acute kidney failure, unspecified: Secondary | ICD-10-CM | POA: Diagnosis not present

## 2023-11-17 DIAGNOSIS — Z5181 Encounter for therapeutic drug level monitoring: Secondary | ICD-10-CM | POA: Diagnosis not present

## 2023-11-17 DIAGNOSIS — Z79899 Other long term (current) drug therapy: Secondary | ICD-10-CM | POA: Diagnosis not present

## 2023-11-17 NOTE — Discharge Summary (Signed)
 Transplant Surgery Discharge Summary  Patient ID: Christine Strong [id number] 73 y.o. 1950-07-30  Admit date: 11/13/2023  3:58 PM  Discharge date and time: 11/18/2023, afternoon  Admitting Physician: Damien Lamarr Almarie Marlane, MD  Discharge Physician: Lamar Fairy Plain, MD  Admission Diagnoses:  Transplant [Z94.9]  Discharge Diagnoses:  Principal Problem:   Deceased-donor kidney transplant recipient (CMD) Active Problems:   Transplant   Immunosuppression (CMD)   Type 2 diabetes mellitus with hyperglycemia, without long-term current use of insulin    (CMD) Resolved Problems:   * No resolved hospital problems. *  Admission Condition: good Discharged Condition: good  Indication for Admission: Christine Strong is a 73 y.o. Caucasian female with CKD stage 5 secondary to T2DM/HTN who presented for deceased donor kidney transplant with NRP.    Hospital Course:  Patient is a 73 y.o. Caucasian female with CKD stage 5 secondary to T2DM/HTN. Patient was not on dialysis, still making normal amounts of urine. PMH otherwise significant for: hypertensive retinopathy, mild aortic stenosis, HFpEF, and HLD.   On 11/13/2023, she completed a preemptive DDRT to the right pelvis. PRA 0%. HLA MM 1-2-2. CMV D-/R+. Donor was a 73 y.o. WM, BMI 36.927, KDPI 55%. COD: cerebrovascular/stroke. Campath induction. Transplant ureteral stent placed. OnQ bupivacaine nerve block pump was placed in the OR for pain control. Patient tolerated the procedure well and did well post-operatively. Had SGF. Foley catheter was removed and PVRs checked prior to discharge. JP drain was removed. Anticipated immunosuppression with FK goal 6-9, Myfortic 360 mg BID, prednisone taper to 5 mg once FK therapeutic. Prophylaxis anticipated with diflucan for one month, Valcyte for three months, and Bactrim at least twelve months post transplant.   At the time of discharge, the patient was afebrile, in no acute distress and vital  signs were stable. The patient's incision/wound was C/D/I, there was no purulent drainage, erythema extending from the incision, or any signs of infection on exam. The pt was ambulating, tolerating PO nutrition, and back to baseline.  Patients pain was well controlled with the OnQ bupivacaine nerve block pump and this was continued at discharge. Patient was instructed on how to remove at home.    The patient has met all goals necessary for discharge from a medical, surgical and therapy standpoint. Thus, the patient is stable and suitable for discharge. New discharge medications were discussed in detail and the patient stated understanding of use and administration by the transplant pharmacist. The patient was educated on measuring VS, intake/output, weight and handouts given. Patient also met with the dietician prior to discharge to review new diet changes and expectations after transplant with dietary handout given. Educated on wound care and importance of clinic visits. The patient verbalized understanding of all discharge instructions and therefore was released. The patient was instructed to call if they develop a fever greater than 101.5, any purulent or increased drainage from the incision, redness extending from the incision, uncontrolled pain or any other questions or concerns.  Induction: Campath Foley: removed Stent: one stent placed JP drain: removed Central Line/Powerline: no Antibiotics: n/a Stop Date: n/a One Month Protocol Biopsy: pending Cr at one month  Patient's Ordered Code Status: Full Code  Consults: SICU Nephrology  Physical Therapy Occupational Therapy  Significant Diagnostic Studies:  XR Chest 1 View  Final Result by Bennet Ozell Hurst, MD (11/08 1517)  XR CHEST 1 VIEW, 11/15/2023 10:04 AM    INDICATION:increasing O2 requirements and tachypnea 2 days after kidney   transplant  COMPARISON: 11/14/2023    FINDINGS:     Supportive devices: None   Cardiovascular/lungs/pleura: Similar sized cardiomediastinal silhouette.   Diffuse interstitial opacities scattered in bilateral lungs. No   pneumothorax or pleural effusion.  Other: No interval osseous changes.    IMPRESSION:  1. Mild cardiomegaly with diffuse interstitial opacities in bilateral   lungs, possibly suggestive of pulmonary edema and/or   aspiration/atelectasis.  2. Left retrocardiac opacity which could be secondary to atelectasis or   aspiration.        US  Kidney Transplant W Doppler Right  Final Result by Blima Paris, DO (11/07 1815)  US  KIDNEY TRANSPLANT W DOPPLER RIGHT, 11/14/2023 10:42 AM    INDICATION: post transplant   COMPARISON: CT abdomen/pelvis 06/17/2023    TECHNIQUE: Multi-planar real-time grayscale ultrasound imaging as well as   duplex assessment with color and spectral Doppler analysis of the   transplant kidney and its associated vasculature was performed.    FINDINGS:    ANATOMIC:  .  Location: RIGHT lower quadrant.  .  Size = 10.9 x 5.7 x 5.3 cm.  Estimated volume = 172 cm^3.  SABRA  Morphology: Unremarkable. No large cysts, solid masses, or stones.  .  Hydronephrosis: Nephroureteral stent in situ. No hydronephrosis.   .  Peritransplant (deep) collection: Small collection along the upper pole   measuring 2.4 x 2.7 x 2.3 cm. Tiny amount of free fluid at the lower pole.  .  Subincisional (superficial) collection: None.  .  Ureter/Bladder: Foley catheter is within a decompressed urinary   bladder.    VASCULAR:    Resistive indices as obtained from the arcuate arteries are as follows:  0.79, 0.73 in the upper pole, borderline elevated.  0.71, 0.78 in the interpolar region, borderline elevated  0.74, 0.72 in the lower pole, within normal range    Transplant Renal Artery (Peak systolic velocity estimates):  SABRA  Proximal: 145 cm/sec  .  Mid: 129 cm/sec  .  Distal: 113 cm/sec  .  Waveform: Low resistance.    Transplant Renal Vein:  Patent.     Iliac Artery (Peak systolic velocity estimates):  SABRA  Proximal to anastomosis: 254 cm/sec  .  Distal to anastomosis: 169 cm/sec    ADDITIONAL COMMENTS: None.    IMPRESSION:    1.  Patent renal transplant vasculature with normal to borderline elevated   resistive indices. Attention on follow-up.  2.  No allograft hydronephrosis. Nephroureteral stent in situ.  3.  Small upper pole perinephric collection.    XR Chest 1 View  Final Result by Amada Hildy Carwin, MD (11/07 0915)  XR CHEST 1 VIEW, 11/14/2023 5:39 AM    INDICATION:s/p kidney transplant   COMPARISON: Chest x-ray 11/13/2023    FINDINGS:     Supportive devices: None.  Cardiovascular/lungs/pleura: Cardiac silhouette and pulmonary vasculature   are within normal limits. Bibasilar atelectasis. No focal consolidation.   No pleural effusion or pneumothorax.  Other: Unremarkable.    IMPRESSION:  Bibasilar atelectasis.        XR Chest 1 View  Final Result by Karlton Marilou Ferron, MD (11/06 1651)  XR CHEST 1 VIEW, 11/13/2023 4:38 PM    INDICATION:preop eval   COMPARISON: 06/17/2023    FINDINGS:     Supportive devices: None  Cardiovascular/lungs/pleura: Essentially. Prominent pulmonary vasculature.   No focal consolidation. No pleural effusion or discernible pneumothorax.  Other: No interval osseous changes.    IMPRESSION:  Pulmonary vascular congestion without overt pulmonary edema.  Treatments:  IV hydration, analgesia: Acetaminophen, OnQ bupivacaine nerve block pump, Oxycodone, Lasix, Surgery: DDKT  Disposition: Home  Patient Instructions:  Advised to contact Coordinator Rosina Public, RN, phone number [phone]. If unable to contact call front desk number [phone] and ask for coordinator in her office. Office is open from 8 am to 5pm.  PAL line [phone] on weekends, holidays, and after office hours.  Call with temp > 100.5, nausea, vomiting, diarrhea, uncontrolled pain,  incision issues (redness, drainage that is milky or foul smelling, or incision opening), decreased urine output or any issues you may have concerns about.  First clinic visit given on discharge instructions.  A biopsy is done at 2-4 weeks and instructions for this will follow.  If you have a stent in place that is removed by urology at about 3 weeks and will be done in the urology dept.  Staples removed at 3-4 weeks. You are able to drive after that point if you are not taking pain pills. Drink around 2-3 liters of water a day and use the bathroom frequently, every 1.5-2 hours with frequent stops with car rides. Advised no lifting , pulling of any heavy weight above 10 pounds x 5 weeks Keep wound clean and dry, ok to shower. Plan for home care with self monitoring of vital signs, urine output, drain output, weight and regular outpatient follow up, as scheduled. Please bring log book to each clinic visit with recorded blood pressure, pulse, weight, temp, urine output, intake amount (how much you drink each day), drain output and blood sugars if diabetic.  Discharge Medications:   Medication List    Some of the medications listed here do not show instructions, such as how often to take the medication. Ask your doctor or nurse how to use these medications. Specifically ask about this and similar medications: dorzolamide (TRUSOPT) 2 % ophthalmic solution    START taking these medications    acetaminophen 500 mg tablet Commonly known as: TYLENOL Take 1 tablet (500 mg total) by mouth every 6 (six) hours as needed for mild pain (1-3).   aspirin 81 mg EC tablet Take 1 tablet (81 mg total) by mouth daily. Start taking on: November 19, 2023   blood-glucose meter Misc Commonly known as: Accu-Chek Guide Glucose Meter Use to check blood sugar at least twice daily or as directed   carvediloL 12.5 mg tablet Commonly known as: COREG Take 1 tablet (12.5 mg total) by mouth in the morning and 1  tablet (12.5 mg total) in the evening. Take with meals.   famotidine 20 mg tablet Commonly known as: PEPCID Take 1 tablet (20 mg total) by mouth nightly for stress ulcer prevention. (Take 1 tablet (20 mg total) by mouth nightly Indications: stress ulcer prevention.)   fluconazole 50 mg tablet Commonly known as: DIFLUCAN Take 1 tablet (50 mg total) by mouth daily. Start taking on: November 19, 2023   glucose blood test strip Use as directed to test blood sugars at least twice a day.   Lancets Misc Use as directed to test blood sugar at least twice a day or as directed   mycophenolate 180 mg Tbec DR tablet Commonly known as: MYFORTIC Take 1 tablet (180 mg total) by mouth 2 (two) times a day.   polyethylene glycol 17 gram Powd powder Commonly known as: Miralax Mix a full cap (17g) in 4-8 ounces of water or juice. Stir until dissolved and take daily as needed for constipation (Take 17 g by mouth daily  as needed for constipation.)   predniSONE 5 mg tablet Commonly known as: DELTASONE Take 4 tablets (20 mg) by mouth daily or as directed Start taking on: November 19, 2023   sennosides-docusate sodium 8.6-50 mg per tablet Commonly known as: Senna-S Take 2 tablets by mouth nightly as needed for constipation.   sulfamethoxazole-trimethoprim 400-80 mg per tablet Commonly known as: Bactrim Take 1 tablet by mouth every Monday, Wednesday and Friday. Start taking on: November 19, 2023   * tacrolimus 1 mg capsule Commonly known as: PROGRAF Take 3 capsules by mouth twice a day (Take 3 capsules (3 mg total) by mouth 2 (two) times a day. Take with 5 mg cap for total daily dose of 8 mg twice a day)   * tacrolimus 5 mg capsule Commonly known as: PROGRAF Take 1 capsule by mouth twice a day (Take 1 capsule (5 mg total) by mouth 2 (two) times a day. Take with 1 mg caps for total of 8 mg twice a day)   valGANciclovir 450 mg tablet Commonly known as: VALCYTE Take 1 tablet by mouth every  Monday, Wednesday, and Friday (Take 1 tablet (450 mg total) by mouth 3 (three) times a week.) Start taking on: November 19, 2023      * * This list has 2 medication(s) that are the same as other medications prescribed for you. Read the directions carefully, and ask your doctor or other care provider to review them with you.          CHANGE how you take these medications    dorzolamide 2 % ophthalmic solution Commonly known as: TRUSOPT __________________________ What changed: See the new instructions.   ferrous sulfate 325 mg (65 mg iron ) tablet Commonly known as: iron  Take 1 tablet (325 mg total) by mouth every other day. What changed: when to take this       CONTINUE taking these medications    brimonidine 0.2 % ophthalmic solution Commonly known as: ALPHAGAN Administer 1 drop into left eye daily.   Januvia 25 mg tablet Generic drug: SITagliptin phosphate Take 25 mg by mouth daily.   melatonin 3 mg tablet Take 3 mg by mouth at bedtime.   NIFEdipine 60 mg 24 hr tablet Commonly known as: PROCARDIA XL Take 1 tablet (60 mg total) by mouth daily.   timolol 0.5 % ophthalmic solution Commonly known as: TIMOPTIC INSTILL ONE DROP TO THE AFFECTED EYE(S) TWICE A DAY   traZODone 50 mg tablet Commonly known as: DESYREL Take 50 mg by mouth nightly.       STOP taking these medications    cloNIDine  0.1 mg tablet Commonly known as: CATAPRES    cyanocobalamin  1,000 mcg tablet Commonly known as: VITAMIN B12   ergocalciferol 1,250 mcg (50,000 unit) capsule Commonly known as: VITAMIN D2   hydrALAZINE 100 mg tablet Commonly known as: APRESOLINE   lisinopriL  40 mg tablet Commonly known as: PRINIVIL    metoprolol tartrate 25 mg tablet Commonly known as: LOPRESSOR   sodium bicarbonate 650 mg tablet   sodium zirconium cyclosilicate 10 g Pwpk packet Commonly known as: Lokelma         Where to Get Your Medications     These medications were sent to [facility]  Specialty [facility] [address] Floor, [place]  [id number]    Hours: Mon-Fri: 8:30am-5pm; Sat-Sun: Closed; Holidays: Closed Thanksgiving and Christmas Phone: [phone]  aspirin 81 mg EC tablet blood-glucose meter Misc carvediloL 12.5 mg tablet famotidine 20 mg tablet fluconazole 50 mg  tablet glucose blood test strip Lancets Misc mycophenolate 180 mg Tbec DR tablet polyethylene glycol 17 gram Powd powder predniSONE 5 mg tablet sennosides-docusate sodium 8.6-50 mg per tablet sulfamethoxazole-trimethoprim 400-80 mg per tablet tacrolimus 1 mg capsule tacrolimus 5 mg capsule valGANciclovir 450 mg tablet    Information about where to get these medications is not yet available   Ask your nurse or doctor about these medications acetaminophen 500 mg tablet brimonidine 0.2 % ophthalmic solution ferrous sulfate 325 mg (65 mg iron ) tablet    Follow-up Appointments: Scheduled Future Appointments       Provider Department Dept Phone Center   11/21/2023 8:30 AM [facility] TRANSPLANT MASTER SCHEDULE [facility] [place] [facility] Abdominal Organ Transplant [phone] [facility] [place]   11/25/2023 8:30 AM [facility] TRANSPLANT MASTER SCHEDULE [facility] [place] [facility] Abdominal Organ Transplant [phone] [facility] [place]   11/28/2023 8:30 AM [facility] TRANSPLANT MASTER SCHEDULE [facility] [place] [facility] Abdominal Organ Transplant [phone] [facility] [place]   12/02/2023 8:30 AM [facility] TRANSPLANT MASTER SCHEDULE [facility] [place] [facility] Abdominal Organ Transplant [phone] [facility] [place]   12/16/2023 10:45 AM Soroush Rais-Bahrami [facility] [place] [facility] Urology Oncology [phone] [facility]   02/17/2024 2:10 PM Rajiv Allene Fairly [facility]  - Ophthalmology [place] [phone]    03/11/2024 1:40 PM Adetoye Lufadeju [facility]  - NEPHROLOGY [place] [phone] [facility]    11/15/2024 1:00 PM [facility] VASCULAR 3 [facility]  - Vascular and Endovascular Surgery [phone] [facility] [address]   11/15/2024 1:45 PM Ashlee Almarie Ober [facility]  - Vascular and Endovascular Surgery [phone] [facility] [address] [facility]      Electronically signed by: Rocky Lum Ada, PA-C 11/18/2023 10:37 AM  Time spent on discharge: 45 minutes  Addendum:  I personally examined the patient, reviewed lab and clinical data, adjusted immunosuppression, discussed plan of care with patient, and confirm the above assessment.  Other than above, a 14-point ROS was negative. Medical decision-making in its entirety as documented by Rocky Ada was personally performed by myself.  Kidney USN okay, exam benign, incision okay, Foley and drain out, return of GI function, diet advanced and tolerated, education completed, ambulatory, Scr stable, urine output okay, H/H stable, BP and BGs okay (will start Januvia), RTC in 2-3 days. I have reviewed the documentation and agree with it as stated.  Time that I spent on discharge: 45 minutes.    RJS  *Some images could not be shown.

## 2023-11-18 DIAGNOSIS — Z79899 Other long term (current) drug therapy: Secondary | ICD-10-CM | POA: Diagnosis not present

## 2023-11-18 DIAGNOSIS — Z5181 Encounter for therapeutic drug level monitoring: Secondary | ICD-10-CM | POA: Diagnosis not present

## 2023-11-18 DIAGNOSIS — Z940 Kidney transplant status: Secondary | ICD-10-CM | POA: Diagnosis not present

## 2023-11-18 DIAGNOSIS — E139 Other specified diabetes mellitus without complications: Secondary | ICD-10-CM | POA: Diagnosis not present

## 2023-11-18 DIAGNOSIS — I10 Essential (primary) hypertension: Secondary | ICD-10-CM | POA: Diagnosis not present

## 2023-11-18 DIAGNOSIS — N179 Acute kidney failure, unspecified: Secondary | ICD-10-CM | POA: Diagnosis not present

## 2023-11-18 DIAGNOSIS — Z949 Transplanted organ and tissue status, unspecified: Secondary | ICD-10-CM | POA: Diagnosis not present

## 2023-11-18 DIAGNOSIS — Z4822 Encounter for aftercare following kidney transplant: Secondary | ICD-10-CM | POA: Diagnosis not present

## 2023-11-18 DIAGNOSIS — Z79621 Long term (current) use of calcineurin inhibitor: Secondary | ICD-10-CM | POA: Diagnosis not present

## 2023-11-19 ENCOUNTER — Other Ambulatory Visit: Payer: Self-pay | Admitting: Gastroenterology

## 2023-11-21 DIAGNOSIS — Z299 Encounter for prophylactic measures, unspecified: Secondary | ICD-10-CM | POA: Diagnosis not present

## 2023-11-21 DIAGNOSIS — I120 Hypertensive chronic kidney disease with stage 5 chronic kidney disease or end stage renal disease: Secondary | ICD-10-CM | POA: Diagnosis not present

## 2023-11-21 DIAGNOSIS — E785 Hyperlipidemia, unspecified: Secondary | ICD-10-CM | POA: Diagnosis not present

## 2023-11-21 DIAGNOSIS — Z940 Kidney transplant status: Secondary | ICD-10-CM | POA: Diagnosis not present

## 2023-11-21 DIAGNOSIS — Z4822 Encounter for aftercare following kidney transplant: Secondary | ICD-10-CM | POA: Diagnosis not present

## 2023-11-21 DIAGNOSIS — Z5181 Encounter for therapeutic drug level monitoring: Secondary | ICD-10-CM | POA: Diagnosis not present

## 2023-11-21 DIAGNOSIS — N185 Chronic kidney disease, stage 5: Secondary | ICD-10-CM | POA: Diagnosis not present

## 2023-11-21 DIAGNOSIS — E1122 Type 2 diabetes mellitus with diabetic chronic kidney disease: Secondary | ICD-10-CM | POA: Diagnosis not present

## 2023-11-21 DIAGNOSIS — D849 Immunodeficiency, unspecified: Secondary | ICD-10-CM | POA: Diagnosis not present

## 2023-11-21 DIAGNOSIS — H35039 Hypertensive retinopathy, unspecified eye: Secondary | ICD-10-CM | POA: Diagnosis not present

## 2023-11-25 DIAGNOSIS — Z940 Kidney transplant status: Secondary | ICD-10-CM | POA: Diagnosis not present

## 2023-11-25 DIAGNOSIS — Z789 Other specified health status: Secondary | ICD-10-CM | POA: Diagnosis not present

## 2023-11-25 DIAGNOSIS — I10 Essential (primary) hypertension: Secondary | ICD-10-CM | POA: Diagnosis not present

## 2023-11-25 DIAGNOSIS — D849 Immunodeficiency, unspecified: Secondary | ICD-10-CM | POA: Diagnosis not present

## 2023-11-25 DIAGNOSIS — E1165 Type 2 diabetes mellitus with hyperglycemia: Secondary | ICD-10-CM | POA: Diagnosis not present

## 2023-11-25 DIAGNOSIS — Z5181 Encounter for therapeutic drug level monitoring: Secondary | ICD-10-CM | POA: Diagnosis not present

## 2023-11-25 DIAGNOSIS — D509 Iron deficiency anemia, unspecified: Secondary | ICD-10-CM | POA: Diagnosis not present

## 2023-11-25 DIAGNOSIS — Z79621 Long term (current) use of calcineurin inhibitor: Secondary | ICD-10-CM | POA: Diagnosis not present

## 2023-11-25 DIAGNOSIS — R93421 Abnormal radiologic findings on diagnostic imaging of right kidney: Secondary | ICD-10-CM | POA: Diagnosis not present

## 2023-11-25 DIAGNOSIS — E785 Hyperlipidemia, unspecified: Secondary | ICD-10-CM | POA: Diagnosis not present

## 2023-11-25 DIAGNOSIS — Z794 Long term (current) use of insulin: Secondary | ICD-10-CM | POA: Diagnosis not present

## 2023-11-25 DIAGNOSIS — Z2989 Encounter for other specified prophylactic measures: Secondary | ICD-10-CM | POA: Diagnosis not present

## 2023-11-25 DIAGNOSIS — Z4822 Encounter for aftercare following kidney transplant: Secondary | ICD-10-CM | POA: Diagnosis not present

## 2023-11-28 DIAGNOSIS — E875 Hyperkalemia: Secondary | ICD-10-CM | POA: Diagnosis not present

## 2023-11-28 DIAGNOSIS — E785 Hyperlipidemia, unspecified: Secondary | ICD-10-CM | POA: Diagnosis not present

## 2023-11-28 DIAGNOSIS — D509 Iron deficiency anemia, unspecified: Secondary | ICD-10-CM | POA: Diagnosis not present

## 2023-11-28 DIAGNOSIS — Z789 Other specified health status: Secondary | ICD-10-CM | POA: Diagnosis not present

## 2023-11-28 DIAGNOSIS — I10 Essential (primary) hypertension: Secondary | ICD-10-CM | POA: Diagnosis not present

## 2023-11-28 DIAGNOSIS — Z2989 Encounter for other specified prophylactic measures: Secondary | ICD-10-CM | POA: Diagnosis not present

## 2023-11-28 DIAGNOSIS — Z940 Kidney transplant status: Secondary | ICD-10-CM | POA: Diagnosis not present

## 2023-11-28 DIAGNOSIS — D849 Immunodeficiency, unspecified: Secondary | ICD-10-CM | POA: Diagnosis not present

## 2023-11-28 DIAGNOSIS — E1129 Type 2 diabetes mellitus with other diabetic kidney complication: Secondary | ICD-10-CM | POA: Diagnosis not present

## 2023-11-28 DIAGNOSIS — Z5181 Encounter for therapeutic drug level monitoring: Secondary | ICD-10-CM | POA: Diagnosis not present

## 2023-11-28 DIAGNOSIS — Z4822 Encounter for aftercare following kidney transplant: Secondary | ICD-10-CM | POA: Diagnosis not present

## 2023-12-02 DIAGNOSIS — E875 Hyperkalemia: Secondary | ICD-10-CM | POA: Diagnosis not present

## 2023-12-02 DIAGNOSIS — Z4822 Encounter for aftercare following kidney transplant: Secondary | ICD-10-CM | POA: Diagnosis not present

## 2023-12-02 DIAGNOSIS — D649 Anemia, unspecified: Secondary | ICD-10-CM | POA: Diagnosis not present

## 2023-12-02 DIAGNOSIS — I517 Cardiomegaly: Secondary | ICD-10-CM | POA: Diagnosis not present

## 2023-12-02 DIAGNOSIS — D849 Immunodeficiency, unspecified: Secondary | ICD-10-CM | POA: Diagnosis not present

## 2023-12-02 DIAGNOSIS — R1024 Suprapubic pain: Secondary | ICD-10-CM | POA: Diagnosis not present

## 2023-12-02 DIAGNOSIS — E1165 Type 2 diabetes mellitus with hyperglycemia: Secondary | ICD-10-CM | POA: Diagnosis not present

## 2023-12-02 DIAGNOSIS — Z8679 Personal history of other diseases of the circulatory system: Secondary | ICD-10-CM | POA: Diagnosis not present

## 2023-12-02 DIAGNOSIS — E785 Hyperlipidemia, unspecified: Secondary | ICD-10-CM | POA: Diagnosis not present

## 2023-12-02 DIAGNOSIS — G4700 Insomnia, unspecified: Secondary | ICD-10-CM | POA: Diagnosis not present

## 2023-12-02 DIAGNOSIS — E1122 Type 2 diabetes mellitus with diabetic chronic kidney disease: Secondary | ICD-10-CM | POA: Diagnosis not present

## 2023-12-02 DIAGNOSIS — Z2989 Encounter for other specified prophylactic measures: Secondary | ICD-10-CM | POA: Diagnosis not present

## 2023-12-02 DIAGNOSIS — R55 Syncope and collapse: Secondary | ICD-10-CM | POA: Diagnosis not present

## 2023-12-02 DIAGNOSIS — Z940 Kidney transplant status: Secondary | ICD-10-CM | POA: Diagnosis not present

## 2023-12-02 DIAGNOSIS — I120 Hypertensive chronic kidney disease with stage 5 chronic kidney disease or end stage renal disease: Secondary | ICD-10-CM | POA: Diagnosis not present

## 2023-12-02 DIAGNOSIS — I672 Cerebral atherosclerosis: Secondary | ICD-10-CM | POA: Diagnosis not present

## 2023-12-02 DIAGNOSIS — N186 End stage renal disease: Secondary | ICD-10-CM | POA: Diagnosis not present

## 2023-12-08 DIAGNOSIS — Z79899 Other long term (current) drug therapy: Secondary | ICD-10-CM | POA: Diagnosis not present

## 2023-12-08 DIAGNOSIS — Z5181 Encounter for therapeutic drug level monitoring: Secondary | ICD-10-CM | POA: Diagnosis not present

## 2023-12-08 DIAGNOSIS — D849 Immunodeficiency, unspecified: Secondary | ICD-10-CM | POA: Diagnosis not present

## 2023-12-08 DIAGNOSIS — D649 Anemia, unspecified: Secondary | ICD-10-CM | POA: Diagnosis not present

## 2023-12-08 DIAGNOSIS — E782 Mixed hyperlipidemia: Secondary | ICD-10-CM | POA: Diagnosis not present

## 2023-12-08 DIAGNOSIS — Z4822 Encounter for aftercare following kidney transplant: Secondary | ICD-10-CM | POA: Diagnosis not present

## 2023-12-08 DIAGNOSIS — H35039 Hypertensive retinopathy, unspecified eye: Secondary | ICD-10-CM | POA: Diagnosis not present

## 2023-12-08 DIAGNOSIS — E875 Hyperkalemia: Secondary | ICD-10-CM | POA: Diagnosis not present

## 2023-12-08 DIAGNOSIS — Z8679 Personal history of other diseases of the circulatory system: Secondary | ICD-10-CM | POA: Diagnosis not present

## 2023-12-08 DIAGNOSIS — Z9181 History of falling: Secondary | ICD-10-CM | POA: Diagnosis not present

## 2023-12-08 DIAGNOSIS — Z940 Kidney transplant status: Secondary | ICD-10-CM | POA: Diagnosis not present

## 2023-12-08 DIAGNOSIS — E213 Hyperparathyroidism, unspecified: Secondary | ICD-10-CM | POA: Diagnosis not present

## 2023-12-11 DIAGNOSIS — Z4822 Encounter for aftercare following kidney transplant: Secondary | ICD-10-CM | POA: Diagnosis not present

## 2023-12-11 DIAGNOSIS — G4700 Insomnia, unspecified: Secondary | ICD-10-CM | POA: Diagnosis not present

## 2023-12-11 DIAGNOSIS — E119 Type 2 diabetes mellitus without complications: Secondary | ICD-10-CM | POA: Diagnosis not present

## 2023-12-11 DIAGNOSIS — I10 Essential (primary) hypertension: Secondary | ICD-10-CM | POA: Diagnosis not present

## 2023-12-11 DIAGNOSIS — D649 Anemia, unspecified: Secondary | ICD-10-CM | POA: Diagnosis not present

## 2023-12-11 DIAGNOSIS — Z2989 Encounter for other specified prophylactic measures: Secondary | ICD-10-CM | POA: Diagnosis not present

## 2023-12-11 DIAGNOSIS — Z940 Kidney transplant status: Secondary | ICD-10-CM | POA: Diagnosis not present

## 2023-12-11 DIAGNOSIS — D849 Immunodeficiency, unspecified: Secondary | ICD-10-CM | POA: Diagnosis not present

## 2023-12-11 DIAGNOSIS — E785 Hyperlipidemia, unspecified: Secondary | ICD-10-CM | POA: Diagnosis not present

## 2023-12-11 DIAGNOSIS — Z5181 Encounter for therapeutic drug level monitoring: Secondary | ICD-10-CM | POA: Diagnosis not present

## 2023-12-11 DIAGNOSIS — E875 Hyperkalemia: Secondary | ICD-10-CM | POA: Diagnosis not present

## 2023-12-15 ENCOUNTER — Encounter: Admitting: Gastroenterology

## 2023-12-23 DIAGNOSIS — D849 Immunodeficiency, unspecified: Secondary | ICD-10-CM | POA: Diagnosis not present

## 2023-12-23 DIAGNOSIS — Z2989 Encounter for other specified prophylactic measures: Secondary | ICD-10-CM | POA: Diagnosis not present

## 2023-12-23 DIAGNOSIS — I10 Essential (primary) hypertension: Secondary | ICD-10-CM | POA: Diagnosis not present

## 2023-12-23 DIAGNOSIS — Z7952 Long term (current) use of systemic steroids: Secondary | ICD-10-CM | POA: Diagnosis not present

## 2023-12-23 DIAGNOSIS — H35039 Hypertensive retinopathy, unspecified eye: Secondary | ICD-10-CM | POA: Diagnosis not present

## 2023-12-23 DIAGNOSIS — Z79621 Long term (current) use of calcineurin inhibitor: Secondary | ICD-10-CM | POA: Diagnosis not present

## 2023-12-23 DIAGNOSIS — E785 Hyperlipidemia, unspecified: Secondary | ICD-10-CM | POA: Diagnosis not present

## 2023-12-23 DIAGNOSIS — E11649 Type 2 diabetes mellitus with hypoglycemia without coma: Secondary | ICD-10-CM | POA: Diagnosis not present

## 2023-12-23 DIAGNOSIS — Z940 Kidney transplant status: Secondary | ICD-10-CM | POA: Diagnosis not present

## 2023-12-23 DIAGNOSIS — D649 Anemia, unspecified: Secondary | ICD-10-CM | POA: Diagnosis not present

## 2023-12-23 DIAGNOSIS — Z4822 Encounter for aftercare following kidney transplant: Secondary | ICD-10-CM | POA: Diagnosis not present

## 2023-12-23 DIAGNOSIS — Z5181 Encounter for therapeutic drug level monitoring: Secondary | ICD-10-CM | POA: Diagnosis not present

## 2023-12-24 ENCOUNTER — Other Ambulatory Visit (HOSPITAL_BASED_OUTPATIENT_CLINIC_OR_DEPARTMENT_OTHER): Payer: Self-pay | Admitting: Family Medicine

## 2023-12-24 DIAGNOSIS — Z780 Asymptomatic menopausal state: Secondary | ICD-10-CM

## 2023-12-24 DIAGNOSIS — Z0000 Encounter for general adult medical examination without abnormal findings: Secondary | ICD-10-CM | POA: Diagnosis not present

## 2023-12-24 DIAGNOSIS — Z1331 Encounter for screening for depression: Secondary | ICD-10-CM | POA: Diagnosis not present

## 2024-02-03 ENCOUNTER — Encounter: Payer: Self-pay | Admitting: *Deleted

## 2024-02-03 NOTE — Progress Notes (Signed)
 MARKEETA SCALF                                          MRN: [id number]   02/03/2024   The [facility] Quality Team Specialist reviewed this patient medical record for the purposes of chart review for care gap closure. The following were reviewed: abstraction for care gap closure-glycemic status assessment.    [facility] Quality Team
